# Patient Record
Sex: Female | Born: 1945 | Race: Black or African American | Hispanic: No | Smoking: Never smoker
Health system: Southern US, Community
[De-identification: ages and names within clinical notes are randomized; demographics above are authoritative.]

## PROBLEM LIST (undated history)

## (undated) DIAGNOSIS — I4891 Unspecified atrial fibrillation: Secondary | ICD-10-CM

## (undated) DIAGNOSIS — Z86711 Personal history of pulmonary embolism: Secondary | ICD-10-CM

## (undated) DIAGNOSIS — M1990 Unspecified osteoarthritis, unspecified site: Secondary | ICD-10-CM

## (undated) DIAGNOSIS — K649 Unspecified hemorrhoids: Secondary | ICD-10-CM

## (undated) DIAGNOSIS — E559 Vitamin D deficiency, unspecified: Secondary | ICD-10-CM

## (undated) DIAGNOSIS — I10 Essential (primary) hypertension: Secondary | ICD-10-CM

## (undated) DIAGNOSIS — E785 Hyperlipidemia, unspecified: Secondary | ICD-10-CM

## (undated) DIAGNOSIS — I499 Cardiac arrhythmia, unspecified: Secondary | ICD-10-CM

## (undated) DIAGNOSIS — K219 Gastro-esophageal reflux disease without esophagitis: Secondary | ICD-10-CM

## (undated) DIAGNOSIS — K5900 Constipation, unspecified: Secondary | ICD-10-CM

## (undated) DIAGNOSIS — D649 Anemia, unspecified: Secondary | ICD-10-CM

## (undated) HISTORY — PX: ABDOMINAL HYSTERECTOMY: SHX81

## (undated) HISTORY — PX: CATARACT EXTRACTION, BILATERAL: SHX1313

## (undated) HISTORY — PX: OTHER SURGICAL HISTORY: SHX169

## (undated) HISTORY — PX: COLONOSCOPY: SHX174

## (undated) HISTORY — PX: CARPAL TUNNEL RELEASE: SHX101

## (undated) HISTORY — PX: JOINT REPLACEMENT: SHX530

## (undated) HISTORY — PX: CARDIAC CATHETERIZATION: SHX172

## (undated) HISTORY — PX: EYE SURGERY: SHX253

---

## 2004-02-29 ENCOUNTER — Ambulatory Visit: Payer: Self-pay | Admitting: Family Medicine

## 2004-06-21 ENCOUNTER — Ambulatory Visit: Payer: Self-pay | Admitting: Unknown Physician Specialty

## 2005-03-10 ENCOUNTER — Ambulatory Visit: Payer: Self-pay | Admitting: Internal Medicine

## 2005-06-20 ENCOUNTER — Emergency Department: Payer: Self-pay | Admitting: Emergency Medicine

## 2006-03-15 ENCOUNTER — Ambulatory Visit: Payer: Self-pay | Admitting: Internal Medicine

## 2006-12-18 ENCOUNTER — Ambulatory Visit: Payer: Self-pay | Admitting: Internal Medicine

## 2007-03-21 ENCOUNTER — Ambulatory Visit: Payer: Self-pay | Admitting: Internal Medicine

## 2009-03-04 ENCOUNTER — Ambulatory Visit: Payer: Self-pay | Admitting: Internal Medicine

## 2009-09-27 ENCOUNTER — Ambulatory Visit: Payer: Self-pay | Admitting: Unknown Physician Specialty

## 2009-11-29 ENCOUNTER — Emergency Department: Payer: Self-pay | Admitting: Emergency Medicine

## 2010-02-16 ENCOUNTER — Ambulatory Visit: Payer: Self-pay | Admitting: Unknown Physician Specialty

## 2010-04-12 ENCOUNTER — Ambulatory Visit: Payer: Self-pay | Admitting: Family Medicine

## 2011-04-18 ENCOUNTER — Ambulatory Visit: Payer: Self-pay | Admitting: Family Medicine

## 2011-04-21 ENCOUNTER — Ambulatory Visit: Payer: Self-pay | Admitting: Family Medicine

## 2012-05-07 ENCOUNTER — Ambulatory Visit: Payer: Self-pay | Admitting: Family Medicine

## 2012-10-14 ENCOUNTER — Ambulatory Visit: Payer: Self-pay | Admitting: Unknown Physician Specialty

## 2012-10-16 LAB — PATHOLOGY REPORT

## 2013-05-12 ENCOUNTER — Ambulatory Visit: Payer: Self-pay | Admitting: Family Medicine

## 2014-02-25 DIAGNOSIS — K219 Gastro-esophageal reflux disease without esophagitis: Secondary | ICD-10-CM | POA: Insufficient documentation

## 2014-02-25 DIAGNOSIS — R7302 Impaired glucose tolerance (oral): Secondary | ICD-10-CM | POA: Insufficient documentation

## 2014-02-25 DIAGNOSIS — E785 Hyperlipidemia, unspecified: Secondary | ICD-10-CM | POA: Insufficient documentation

## 2014-05-14 ENCOUNTER — Ambulatory Visit: Payer: Self-pay | Admitting: Family Medicine

## 2014-05-28 ENCOUNTER — Ambulatory Visit: Payer: Self-pay | Admitting: Family Medicine

## 2014-10-12 DIAGNOSIS — M1712 Unilateral primary osteoarthritis, left knee: Secondary | ICD-10-CM | POA: Insufficient documentation

## 2014-10-12 DIAGNOSIS — M25552 Pain in left hip: Secondary | ICD-10-CM | POA: Insufficient documentation

## 2015-03-23 ENCOUNTER — Encounter: Payer: Self-pay | Admitting: *Deleted

## 2015-03-24 ENCOUNTER — Encounter: Payer: Self-pay | Admitting: Anesthesiology

## 2015-03-24 ENCOUNTER — Ambulatory Visit: Payer: Medicare Other | Admitting: Anesthesiology

## 2015-03-24 ENCOUNTER — Ambulatory Visit
Admission: RE | Admit: 2015-03-24 | Discharge: 2015-03-24 | Disposition: A | Discharge status: Home Health | Payer: Medicare Other | Source: Ambulatory Visit | Attending: Unknown Physician Specialty | Admitting: Unknown Physician Specialty

## 2015-03-24 ENCOUNTER — Encounter
Admission: RE | Disposition: A | Discharge status: Home Health | Payer: Self-pay | Source: Ambulatory Visit | Attending: Unknown Physician Specialty

## 2015-03-24 DIAGNOSIS — I10 Essential (primary) hypertension: Secondary | ICD-10-CM | POA: Insufficient documentation

## 2015-03-24 DIAGNOSIS — Z7982 Long term (current) use of aspirin: Secondary | ICD-10-CM | POA: Insufficient documentation

## 2015-03-24 DIAGNOSIS — M1990 Unspecified osteoarthritis, unspecified site: Secondary | ICD-10-CM | POA: Diagnosis not present

## 2015-03-24 DIAGNOSIS — K449 Diaphragmatic hernia without obstruction or gangrene: Secondary | ICD-10-CM | POA: Insufficient documentation

## 2015-03-24 DIAGNOSIS — R1310 Dysphagia, unspecified: Secondary | ICD-10-CM | POA: Diagnosis present

## 2015-03-24 DIAGNOSIS — Z79899 Other long term (current) drug therapy: Secondary | ICD-10-CM | POA: Diagnosis not present

## 2015-03-24 DIAGNOSIS — K219 Gastro-esophageal reflux disease without esophagitis: Secondary | ICD-10-CM | POA: Diagnosis not present

## 2015-03-24 DIAGNOSIS — E785 Hyperlipidemia, unspecified: Secondary | ICD-10-CM | POA: Insufficient documentation

## 2015-03-24 DIAGNOSIS — E559 Vitamin D deficiency, unspecified: Secondary | ICD-10-CM | POA: Insufficient documentation

## 2015-03-24 DIAGNOSIS — R109 Unspecified abdominal pain: Secondary | ICD-10-CM | POA: Diagnosis not present

## 2015-03-24 HISTORY — DX: Unspecified osteoarthritis, unspecified site: M19.90

## 2015-03-24 HISTORY — DX: Gastro-esophageal reflux disease without esophagitis: K21.9

## 2015-03-24 HISTORY — DX: Essential (primary) hypertension: I10

## 2015-03-24 HISTORY — DX: Unspecified hemorrhoids: K64.9

## 2015-03-24 HISTORY — PX: ESOPHAGOGASTRODUODENOSCOPY (EGD) WITH PROPOFOL: SHX5813

## 2015-03-24 HISTORY — DX: Vitamin D deficiency, unspecified: E55.9

## 2015-03-24 HISTORY — DX: Constipation, unspecified: K59.00

## 2015-03-24 HISTORY — DX: Hyperlipidemia, unspecified: E78.5

## 2015-03-24 SURGERY — ESOPHAGOGASTRODUODENOSCOPY (EGD) WITH PROPOFOL
Anesthesia: General

## 2015-03-24 MED ORDER — PROPOFOL 10 MG/ML IV BOLUS
INTRAVENOUS | Status: DC | PRN
  Administered 2015-03-24 (×2): 20 mg via INTRAVENOUS
  Administered 2015-03-24 (×2): 50 mg via INTRAVENOUS

## 2015-03-24 MED ORDER — SODIUM CHLORIDE 0.9 % IV SOLN
INTRAVENOUS | Status: DC
  Administered 2015-03-24: 10:00:00 via INTRAVENOUS

## 2015-03-24 MED ORDER — SODIUM CHLORIDE 0.9 % IV SOLN
INTRAVENOUS | Status: DC
  Administered 2015-03-24: 1000 mL via INTRAVENOUS

## 2015-03-24 MED ORDER — GLYCOPYRROLATE 0.2 MG/ML IJ SOLN
INTRAMUSCULAR | Status: DC | PRN
  Administered 2015-03-24: 0.2 mg via INTRAVENOUS

## 2015-03-24 MED ORDER — PROPOFOL 500 MG/50ML IV EMUL
INTRAVENOUS | Status: DC | PRN
  Administered 2015-03-24: 140 ug/kg/min via INTRAVENOUS

## 2015-03-24 MED ORDER — BUTAMBEN-TETRACAINE-BENZOCAINE 2-2-14 % EX AERO
INHALATION_SPRAY | CUTANEOUS | Status: DC | PRN
  Administered 2015-03-24 (×3): 1 via TOPICAL

## 2015-03-24 NOTE — Transfer of Care (Signed)
Immediate Anesthesia Transfer of Care Note  Patient: Laura Zhang  Procedure(s) Performed: Procedure(s): ESOPHAGOGASTRODUODENOSCOPY (EGD) WITH PROPOFOL (N/A)  Patient Location: PACU  Anesthesia Type:General  Level of Consciousness: awake, alert  and oriented  Airway & Oxygen Therapy: Patient Spontanous Breathing and Patient connected to nasal cannula oxygen  Post-op Assessment: Report given to RN and Post -op Vital signs reviewed and stable  Post vital signs: stable  Last Vitals:  Filed Vitals:   03/24/15 0957  BP: 128/74  Pulse: 84  Temp: 37.2 C  Resp: 16    Complications: No apparent anesthesia complications

## 2015-03-24 NOTE — H&P (Signed)
   Primary Care Physician:  [facility], MD Primary Gastroenterologist:  Dr. Vira Agar  Pre-Procedure History & Physical: HPI:  Laura Zhang is a 69 y.o. female is here for an endoscopy.   Past Medical History  Diagnosis Date  . Arthritis   . Constipation   . GERD (gastroesophageal reflux disease)   . Hemorrhoids   . Hyperlipidemia   . Hypertension   . Vitamin D deficiency     Past Surgical History  Procedure Laterality Date  . Abdominal hysterectomy    . Carpal tunnel release Left   . Great toe fusion      Prior to Admission medications   Medication Sig Start Date End Date Taking? Authorizing Provider  acetaminophen (TYLENOL) 650 MG CR tablet Take 650 mg by mouth every 8 (eight) hours as needed for pain.   Yes Historical Provider, MD  amLODipine (NORVASC) 10 MG tablet Take 10 mg by mouth daily.   Yes Historical Provider, MD  aspirin 81 MG tablet Take 81 mg by mouth daily.   Yes Historical Provider, MD  calcium citrate-vitamin D (CITRACAL+D) 315-200 MG-UNIT tablet Take 1 tablet by mouth daily.   Yes Historical Provider, MD  losartan (COZAAR) 50 MG tablet Take 50 mg by mouth daily.   Yes Historical Provider, MD  lovastatin (MEVACOR) 10 MG tablet Take 10 mg by mouth at bedtime.   Yes Historical Provider, MD  ondansetron (ZOFRAN) 4 MG tablet Take 4 mg by mouth every 8 (eight) hours as needed for nausea or vomiting.   Yes Historical Provider, MD  pantoprazole (PROTONIX) 40 MG tablet Take 40 mg by mouth 2 (two) times daily before a meal.   Yes Historical Provider, MD  sucralfate (CARAFATE) 1 G tablet Take 1 g by mouth 4 (four) times daily -  with meals and at bedtime.   Yes Historical Provider, MD  triamterene-hydrochlorothiazide (MAXZIDE-25) 37.5-25 MG tablet Take 1 tablet by mouth daily.   Yes Historical Provider, MD    Allergies as of 03/23/2015  . (Not on File)    History reviewed. No pertinent family history.  Social History   Social History  . Marital  Status: Married    Spouse Name: N/A  . Number of Children: N/A  . Years of Education: N/A   Occupational History  . Not on file.   Social History Main Topics  . Smoking status: Never Smoker   . Smokeless tobacco: Never Used  . Alcohol Use: No  . Drug Use: No  . Sexual Activity: Not on file   Other Topics Concern  . Not on file   Social History Narrative  . No narrative on file    Review of Systems: See HPI, otherwise negative ROS  Physical Exam: There were no vitals taken for this visit. General:   Alert,  pleasant and cooperative in NAD Head:  Normocephalic and atraumatic. Neck:  Supple; no masses or thyromegaly. Lungs:  Clear throughout to auscultation.    Heart:  Regular rate and rhythm. Abdomen:  Soft, nontender and nondistended. Normal bowel sounds, without guarding, and without rebound.   Neurologic:  Alert and  oriented x4;  grossly normal neurologically.  Impression/Plan: Laura Zhang is here for an endoscopy to be performed for dysphagia, abdominal pain, GERD  Risks, benefits, limitations, and alternatives regarding  endoscopy have been reviewed with the patient.  Questions have been answered.  All parties agreeable.   Gaylyn Cheers, MD  03/24/2015, 9:56 AM

## 2015-03-24 NOTE — Anesthesia Preprocedure Evaluation (Signed)
Anesthesia Evaluation  Patient identified by MRN, date of birth, ID band Patient awake    Reviewed: Allergy & Precautions, NPO status , Patient's Chart, lab work & pertinent test results  Airway Mallampati: II  TM Distance: >3 FB Neck ROM: Full    Dental  (+) Partial Lower, Partial Upper   Pulmonary neg pulmonary ROS,    Pulmonary exam normal breath sounds clear to auscultation       Cardiovascular hypertension, Pt. on medications Normal cardiovascular exam     Neuro/Psych negative neurological ROS  negative psych ROS   GI/Hepatic Neg liver ROS, GERD  Medicated and Controlled,  Endo/Other  negative endocrine ROS  Renal/GU negative Renal ROS     Musculoskeletal  (+) Arthritis , Osteoarthritis,    Abdominal Normal abdominal exam  (+)   Peds negative pediatric ROS (+)  Hematology negative hematology ROS (+)   Anesthesia Other Findings   Reproductive/Obstetrics                             Anesthesia Physical Anesthesia Plan  ASA: II  Anesthesia Plan: General   Post-op Pain Management:    Induction: Intravenous  Airway Management Planned: Nasal Cannula  Additional Equipment:   Intra-op Plan:   Post-operative Plan:   Informed Consent: I have reviewed the patients History and Physical, chart, labs and discussed the procedure including the risks, benefits and alternatives for the proposed anesthesia with the patient or authorized representative who has indicated his/her understanding and acceptance.   Dental advisory given  Plan Discussed with: CRNA and Surgeon  Anesthesia Plan Comments:         Anesthesia Quick Evaluation

## 2015-03-24 NOTE — Op Note (Signed)
[facility] Gastroenterology Patient Name: Laura Zhang Procedure Date: 03/24/2015 10:16 AM MRN: [id number] Account #: [id number] Date of Birth: 1945/12/25 Admit Type: Outpatient Age: 69 Room: [facility] ENDO ROOM 3 Gender: Female Note Status: Finalized Procedure:         Upper GI endoscopy Providers:         Manya Silvas, MD Referring MD:      Sofie Hartigan (Referring MD) Medicines:         Propofol per Anesthesia, Cetacaine spray Complications:     No immediate complications. Procedure:         Pre-Anesthesia Assessment:                    - After reviewing the risks and benefits, the patient was                     deemed in satisfactory condition to undergo the procedure.                    After obtaining informed consent, the endoscope was passed                     under direct vision. Throughout the procedure, the                     patient's blood pressure, pulse, and oxygen saturations                     were monitored continuously. The Endoscope was introduced                     through the mouth, and advanced to the second part of                     duodenum. The upper GI endoscopy was accomplished without                     difficulty. The patient tolerated the procedure well. Findings:      Patient with very active gag reflex and should be done with spray in the       future.      The examined esophagus was normal. GEJ 39cm.      A small-medium-sized hiatus hernia was present.      The stomach was normal.      The examined duodenum was normal. Impression:        - Normal esophagus.                    - Medium-sized hiatus hernia.                    - Normal stomach.                    - Normal examined duodenum.                    - No specimens collected. Recommendation:    - The findings and recommendations were discussed with the                     patient's family. Eat slowly, chew well, take small bites. Manya Silvas,  MD 03/24/2015 10:44:49 AM This report has been signed electronically. Number of Addenda: 0 Note Initiated On: 03/24/2015 10:16 AM  [facility]

## 2015-03-25 ENCOUNTER — Encounter: Payer: Self-pay | Admitting: Unknown Physician Specialty

## 2015-03-29 NOTE — Anesthesia Postprocedure Evaluation (Signed)
Anesthesia Post Note  Patient: Laura Zhang  Procedure(s) Performed: Procedure(s) (LRB): ESOPHAGOGASTRODUODENOSCOPY (EGD) WITH PROPOFOL (N/A)  Patient location during evaluation: Endoscopy Anesthesia Type: General Level of consciousness: awake, awake and alert and oriented Pain management: pain level controlled Vital Signs Assessment: post-procedure vital signs reviewed and stable Respiratory status: spontaneous breathing Cardiovascular status: blood pressure returned to baseline Anesthetic complications: no    Last Vitals:  Filed Vitals:   03/24/15 1105 03/24/15 1115  BP: 98/59 112/59  Pulse: 65 65  Temp:    Resp: 15 17    Last Pain:  Filed Vitals:   03/25/15 0815  PainSc: 0-No pain                 Julio Storr

## 2015-07-13 ENCOUNTER — Other Ambulatory Visit: Payer: Self-pay | Admitting: Family Medicine

## 2015-07-13 DIAGNOSIS — Z1231 Encounter for screening mammogram for malignant neoplasm of breast: Secondary | ICD-10-CM

## 2015-07-14 ENCOUNTER — Ambulatory Visit
Admission: RE | Admit: 2015-07-14 | Discharge: 2015-07-14 | Disposition: A | Discharge status: Home / Self Care | Payer: Medicare Other | Source: Ambulatory Visit | Attending: Family Medicine | Admitting: Family Medicine

## 2015-07-14 ENCOUNTER — Other Ambulatory Visit: Payer: Self-pay | Admitting: Family Medicine

## 2015-07-14 DIAGNOSIS — Z1231 Encounter for screening mammogram for malignant neoplasm of breast: Secondary | ICD-10-CM

## 2016-04-30 DIAGNOSIS — I9789 Other postprocedural complications and disorders of the circulatory system, not elsewhere classified: Secondary | ICD-10-CM | POA: Insufficient documentation

## 2016-04-30 DIAGNOSIS — I4891 Unspecified atrial fibrillation: Secondary | ICD-10-CM | POA: Insufficient documentation

## 2016-05-30 ENCOUNTER — Encounter
Admission: RE | Admit: 2016-05-30 | Discharge: 2016-05-30 | Disposition: A | Discharge status: Home / Self Care | Payer: Medicare Other | Source: Ambulatory Visit | Attending: Unknown Physician Specialty | Admitting: Unknown Physician Specialty

## 2016-05-30 DIAGNOSIS — Z8262 Family history of osteoporosis: Secondary | ICD-10-CM | POA: Diagnosis not present

## 2016-05-30 DIAGNOSIS — Z96652 Presence of left artificial knee joint: Secondary | ICD-10-CM | POA: Diagnosis present

## 2016-05-30 DIAGNOSIS — Z9889 Other specified postprocedural states: Secondary | ICD-10-CM | POA: Diagnosis not present

## 2016-05-30 DIAGNOSIS — Z833 Family history of diabetes mellitus: Secondary | ICD-10-CM | POA: Diagnosis not present

## 2016-05-30 DIAGNOSIS — Z8249 Family history of ischemic heart disease and other diseases of the circulatory system: Secondary | ICD-10-CM | POA: Diagnosis not present

## 2016-05-30 DIAGNOSIS — Z811 Family history of alcohol abuse and dependence: Secondary | ICD-10-CM | POA: Diagnosis not present

## 2016-05-30 DIAGNOSIS — E785 Hyperlipidemia, unspecified: Secondary | ICD-10-CM | POA: Diagnosis not present

## 2016-05-30 DIAGNOSIS — K219 Gastro-esophageal reflux disease without esophagitis: Secondary | ICD-10-CM | POA: Diagnosis not present

## 2016-05-30 DIAGNOSIS — Z90710 Acquired absence of both cervix and uterus: Secondary | ICD-10-CM | POA: Diagnosis not present

## 2016-05-30 DIAGNOSIS — Z7901 Long term (current) use of anticoagulants: Secondary | ICD-10-CM | POA: Diagnosis not present

## 2016-05-30 DIAGNOSIS — M24662 Ankylosis, left knee: Secondary | ICD-10-CM | POA: Diagnosis present

## 2016-05-30 DIAGNOSIS — I4891 Unspecified atrial fibrillation: Secondary | ICD-10-CM | POA: Diagnosis not present

## 2016-05-30 DIAGNOSIS — E559 Vitamin D deficiency, unspecified: Secondary | ICD-10-CM | POA: Diagnosis not present

## 2016-05-30 DIAGNOSIS — Z86711 Personal history of pulmonary embolism: Secondary | ICD-10-CM | POA: Diagnosis not present

## 2016-05-30 DIAGNOSIS — I10 Essential (primary) hypertension: Secondary | ICD-10-CM | POA: Diagnosis not present

## 2016-05-30 DIAGNOSIS — Z8349 Family history of other endocrine, nutritional and metabolic diseases: Secondary | ICD-10-CM | POA: Diagnosis not present

## 2016-05-30 DIAGNOSIS — Z7982 Long term (current) use of aspirin: Secondary | ICD-10-CM | POA: Diagnosis not present

## 2016-05-30 DIAGNOSIS — Z8261 Family history of arthritis: Secondary | ICD-10-CM | POA: Diagnosis not present

## 2016-05-30 DIAGNOSIS — Z79899 Other long term (current) drug therapy: Secondary | ICD-10-CM | POA: Diagnosis not present

## 2016-05-30 HISTORY — DX: Anemia, unspecified: D64.9

## 2016-05-30 HISTORY — DX: Personal history of pulmonary embolism: Z86.711

## 2016-05-30 HISTORY — DX: Unspecified atrial fibrillation: I48.91

## 2016-05-30 NOTE — Pre-Procedure Instructions (Signed)
Medical clearance on chart. 

## 2016-05-30 NOTE — Patient Instructions (Addendum)
  Your procedure is scheduled on: 05/21/16 @ 8:30 am Report to Same Day Surgery 2nd floor medical mall [facility] Entrance-take elevator on left to 2nd floor.  Check in with surgery information desk.)  Remember: Instructions that are not followed completely may result in serious medical risk, up to and including death, or upon the discretion of your surgeon and anesthesiologist your surgery may need to be rescheduled.    _x___ 1. Do not eat food or drink liquids after midnight. No gum chewing or hard candies.     __x__ 2. No Alcohol for 24 hours before or after surgery.   __x__3. No Smoking for 24 prior to surgery.   ____  4. Bring all medications with you on the day of surgery if instructed.    __x__ 5. Notify your doctor if there is any change in your medical condition     (cold, fever, infections).     Do not wear jewelry, make-up, hairpins, clips or nail polish.  Do not wear lotions, powders, or perfumes. You may wear deodorant.  Do not shave 48 hours prior to surgery. Men may shave face and neck.  Do not bring valuables to the hospital.    [facility] is not responsible for any belongings or valuables.               Contacts, dentures or bridgework may not be worn into surgery.  Leave your suitcase in the car. After surgery it may be brought to your room.  For patients admitted to the hospital, discharge time is determined by your treatment team.   Patients discharged the day of surgery will not be allowed to drive home.  You will need someone to drive you home and stay with you the night of your procedure.    Please read over the following fact sheets that you were given:   [facility] Preparing for Surgery and or MRSA Information   _x___ Take these medicines the morning of surgery with A SIP OF WATER:    1. pantoprazole (PROTONIX  2.  3.  4.  5.  6.  ____Fleets enema or Magnesium Citrate as directed.   _x___ Use CHG Soap or sage wipes as directed on instruction  sheet   ____ Use inhalers on the day of surgery and bring to hospital day of surgery  ____ Stop metformin 2 days prior to surgery    ____ Take 1/2 of usual insulin dose the night before surgery and none on the morning of           surgery.   ____ Stop Aspirin, Coumadin, Pllavix ,Eliquis, Effient, or Pradaxa  x__ Stop Anti-inflammatories such as Advil, Aleve, Ibuprofen, Motrin, Naproxen,          Naprosyn, Goodies powders or aspirin products. Ok to take Tylenol.   ____ Stop supplements until after surgery.    ____ Bring C-Pap to the hospital.

## 2016-05-30 NOTE — Pre-Procedure Instructions (Signed)
Component Name Value Ref Range  Vent Rate (bpm) 69   PR Interval (msec) 188   QRS Interval (msec) 86   QT Interval (msec) 404   QTc (msec) 432   Result Narrative  Normal sinus rhythm Normal ECG No previous ECGs available I reviewed and concur with this report. Electronically signed [id number], D.O., [place] ([id number]) on 04/30/2016 10:51:49 PM  Status Results Details    Hospital Encounter on 04/30/2016 [place]")' href="[email or web address]/">Encounter Summary

## 2016-05-31 ENCOUNTER — Ambulatory Visit
Admission: RE | Admit: 2016-05-31 | Discharge: 2016-05-31 | Disposition: A | Discharge status: Home / Self Care | Payer: Medicare Other | Source: Ambulatory Visit | Attending: Unknown Physician Specialty | Admitting: Unknown Physician Specialty

## 2016-05-31 ENCOUNTER — Encounter
Admission: RE | Disposition: A | Discharge status: Home / Self Care | Payer: Self-pay | Source: Ambulatory Visit | Attending: Unknown Physician Specialty

## 2016-05-31 ENCOUNTER — Ambulatory Visit: Payer: Medicare Other | Admitting: Certified Registered Nurse Anesthetist

## 2016-05-31 ENCOUNTER — Encounter: Payer: Self-pay | Admitting: *Deleted

## 2016-05-31 DIAGNOSIS — M24662 Ankylosis, left knee: Secondary | ICD-10-CM | POA: Insufficient documentation

## 2016-05-31 DIAGNOSIS — Z8349 Family history of other endocrine, nutritional and metabolic diseases: Secondary | ICD-10-CM | POA: Insufficient documentation

## 2016-05-31 DIAGNOSIS — Z8262 Family history of osteoporosis: Secondary | ICD-10-CM | POA: Insufficient documentation

## 2016-05-31 DIAGNOSIS — Z9889 Other specified postprocedural states: Secondary | ICD-10-CM | POA: Insufficient documentation

## 2016-05-31 DIAGNOSIS — E785 Hyperlipidemia, unspecified: Secondary | ICD-10-CM | POA: Insufficient documentation

## 2016-05-31 DIAGNOSIS — Z86711 Personal history of pulmonary embolism: Secondary | ICD-10-CM | POA: Insufficient documentation

## 2016-05-31 DIAGNOSIS — Z8261 Family history of arthritis: Secondary | ICD-10-CM | POA: Insufficient documentation

## 2016-05-31 DIAGNOSIS — Z8249 Family history of ischemic heart disease and other diseases of the circulatory system: Secondary | ICD-10-CM | POA: Insufficient documentation

## 2016-05-31 DIAGNOSIS — Z90710 Acquired absence of both cervix and uterus: Secondary | ICD-10-CM | POA: Insufficient documentation

## 2016-05-31 DIAGNOSIS — Z96652 Presence of left artificial knee joint: Secondary | ICD-10-CM | POA: Insufficient documentation

## 2016-05-31 DIAGNOSIS — K219 Gastro-esophageal reflux disease without esophagitis: Secondary | ICD-10-CM | POA: Insufficient documentation

## 2016-05-31 DIAGNOSIS — I4891 Unspecified atrial fibrillation: Secondary | ICD-10-CM | POA: Insufficient documentation

## 2016-05-31 DIAGNOSIS — E559 Vitamin D deficiency, unspecified: Secondary | ICD-10-CM | POA: Insufficient documentation

## 2016-05-31 DIAGNOSIS — Z833 Family history of diabetes mellitus: Secondary | ICD-10-CM | POA: Insufficient documentation

## 2016-05-31 DIAGNOSIS — Z7901 Long term (current) use of anticoagulants: Secondary | ICD-10-CM | POA: Insufficient documentation

## 2016-05-31 DIAGNOSIS — Z7982 Long term (current) use of aspirin: Secondary | ICD-10-CM | POA: Insufficient documentation

## 2016-05-31 DIAGNOSIS — Z811 Family history of alcohol abuse and dependence: Secondary | ICD-10-CM | POA: Insufficient documentation

## 2016-05-31 DIAGNOSIS — Z79899 Other long term (current) drug therapy: Secondary | ICD-10-CM | POA: Insufficient documentation

## 2016-05-31 DIAGNOSIS — I10 Essential (primary) hypertension: Secondary | ICD-10-CM | POA: Insufficient documentation

## 2016-05-31 HISTORY — PX: KNEE CLOSED REDUCTION: SHX995

## 2016-05-31 SURGERY — MANIPULATION, KNEE, CLOSED
Anesthesia: General | Laterality: Left

## 2016-05-31 MED ORDER — DEXAMETHASONE SODIUM PHOSPHATE 10 MG/ML IJ SOLN
INTRAMUSCULAR | Status: DC | PRN
  Administered 2016-05-31: 5 mg via INTRAVENOUS

## 2016-05-31 MED ORDER — MIDAZOLAM HCL 2 MG/2ML IJ SOLN
INTRAMUSCULAR | Status: DC | PRN
  Administered 2016-05-31: 2 mg via INTRAVENOUS

## 2016-05-31 MED ORDER — LIDOCAINE HCL (PF) 2 % IJ SOLN
INTRAMUSCULAR | Status: AC
  Filled 2016-05-31: qty 2

## 2016-05-31 MED ORDER — LACTATED RINGERS IV SOLN
INTRAVENOUS | Status: DC
  Administered 2016-05-31: 09:00:00 via INTRAVENOUS

## 2016-05-31 MED ORDER — ACETAMINOPHEN 10 MG/ML IV SOLN
INTRAVENOUS | Status: DC | PRN
  Administered 2016-05-31: 1000 mg via INTRAVENOUS

## 2016-05-31 MED ORDER — ACETAMINOPHEN 10 MG/ML IV SOLN
INTRAVENOUS | Status: AC
  Filled 2016-05-31: qty 100

## 2016-05-31 MED ORDER — DEXAMETHASONE SODIUM PHOSPHATE 10 MG/ML IJ SOLN
INTRAMUSCULAR | Status: AC
  Filled 2016-05-31: qty 1

## 2016-05-31 MED ORDER — LIDOCAINE HCL 2 % EX GEL
CUTANEOUS | Status: AC
  Filled 2016-05-31: qty 5

## 2016-05-31 MED ORDER — FENTANYL CITRATE (PF) 100 MCG/2ML IJ SOLN
INTRAMUSCULAR | Status: AC
  Filled 2016-05-31: qty 2

## 2016-05-31 MED ORDER — ONDANSETRON HCL 4 MG/2ML IJ SOLN
INTRAMUSCULAR | Status: AC
  Filled 2016-05-31: qty 2

## 2016-05-31 MED ORDER — SUCCINYLCHOLINE CHLORIDE 200 MG/10ML IV SOSY
PREFILLED_SYRINGE | INTRAVENOUS | Status: AC
  Filled 2016-05-31: qty 10

## 2016-05-31 MED ORDER — LIDOCAINE HCL (CARDIAC) 20 MG/ML IV SOLN
INTRAVENOUS | Status: DC | PRN
  Administered 2016-05-31: 100 mg via INTRAVENOUS

## 2016-05-31 MED ORDER — SUCCINYLCHOLINE CHLORIDE 20 MG/ML IJ SOLN
INTRAMUSCULAR | Status: DC | PRN
  Administered 2016-05-31: 100 mg via INTRAVENOUS

## 2016-05-31 MED ORDER — GLYCOPYRROLATE 0.2 MG/ML IJ SOLN
INTRAMUSCULAR | Status: DC | PRN
  Administered 2016-05-31: 0.2 mg via INTRAVENOUS

## 2016-05-31 MED ORDER — OXYCODONE HCL 5 MG/5ML PO SOLN: 5.0000 mg | Freq: Once | ORAL | Status: DC | PRN

## 2016-05-31 MED ORDER — PROPOFOL 10 MG/ML IV BOLUS
INTRAVENOUS | Status: AC
  Filled 2016-05-31: qty 20

## 2016-05-31 MED ORDER — FENTANYL CITRATE (PF) 100 MCG/2ML IJ SOLN
INTRAMUSCULAR | Status: DC | PRN
  Administered 2016-05-31: 50 ug via INTRAVENOUS

## 2016-05-31 MED ORDER — GLYCOPYRROLATE 0.2 MG/ML IJ SOLN
INTRAMUSCULAR | Status: AC
  Filled 2016-05-31: qty 1

## 2016-05-31 MED ORDER — MIDAZOLAM HCL 2 MG/2ML IJ SOLN
INTRAMUSCULAR | Status: AC
  Filled 2016-05-31: qty 2

## 2016-05-31 MED ORDER — FENTANYL CITRATE (PF) 100 MCG/2ML IJ SOLN: 25.0000 ug | INTRAMUSCULAR | Status: DC | PRN

## 2016-05-31 MED ORDER — PROPOFOL 10 MG/ML IV BOLUS
INTRAVENOUS | Status: DC | PRN
Start: 2016-05-31 — End: 2016-05-31
  Administered 2016-05-31: 150 mg via INTRAVENOUS

## 2016-05-31 MED ORDER — OXYCODONE HCL 5 MG PO TABS: 5.0000 mg | ORAL_TABLET | Freq: Once | ORAL | Status: DC | PRN

## 2016-05-31 MED ORDER — ONDANSETRON HCL 4 MG/2ML IJ SOLN
INTRAMUSCULAR | Status: DC | PRN
  Administered 2016-05-31: 4 mg via INTRAVENOUS

## 2016-05-31 SURGICAL SUPPLY — 1 items: WRAP KNEE W/COLD PACKS 25.5X14 (SOFTGOODS) ×2 IMPLANT

## 2016-05-31 NOTE — Discharge Instructions (Signed)
Ice pack Elevation RTC in about 1 wk. Resume outpt. PT  AMBULATORY SURGERY  DISCHARGE INSTRUCTIONS   1) The drugs that you were given will stay in your system until tomorrow so for the next 24 hours you should not:  A) Drive an automobile B) Make any legal decisions C) Drink any alcoholic beverage   2) You may resume regular meals tomorrow.  Today it is better to start with liquids and gradually work up to solid foods.  You may eat anything you prefer, but it is better to start with liquids, then soup and crackers, and gradually work up to solid foods.   3) Please notify your doctor immediately if you have any unusual bleeding, trouble breathing, redness and pain at the surgery site, drainage, fever, or pain not relieved by medication.    4) Additional Instructions:        Please contact your physician with any problems or Same Day Surgery at [phone], Monday through Friday 6 am to 4 pm, or [place] at [facility] number at [phone].

## 2016-05-31 NOTE — Anesthesia Preprocedure Evaluation (Signed)
Anesthesia Evaluation  Patient identified by MRN, date of birth, ID band Patient awake    Reviewed: Allergy & Precautions, H&P , NPO status , Patient's Chart, lab work & pertinent test results  History of Anesthesia Complications Negative for: history of anesthetic complications  Airway Mallampati: III  TM Distance: >3 FB Neck ROM: limited    Dental no notable dental hx. (+) Poor Dentition, Chipped, Missing, Partial Lower, Partial Upper   Pulmonary neg pulmonary ROS, neg shortness of breath,    Pulmonary exam normal breath sounds clear to auscultation       Cardiovascular Exercise Tolerance: Good hypertension, (-) angina(-) DOE Normal cardiovascular exam+ dysrhythmias Atrial Fibrillation  Rhythm:regular Rate:Normal     Neuro/Psych negative neurological ROS  negative psych ROS   GI/Hepatic Neg liver ROS, GERD  Controlled,  Endo/Other  negative endocrine ROS  Renal/GU      Musculoskeletal  (+) Arthritis ,   Abdominal   Peds  Hematology negative hematology ROS (+)   Anesthesia Other Findings Past Medical History: No date: Anemia No date: Arthritis No date: Atrial fibrillation (HCC) No date: Constipation No date: GERD (gastroesophageal reflux disease) No date: Hemorrhoids No date: History of pulmonary embolus (PE) No date: Hyperlipidemia No date: Hypertension No date: Vitamin D deficiency  Past Surgical History: No date: ABDOMINAL HYSTERECTOMY No date: CARPAL TUNNEL RELEASE Left 03/24/2015: ESOPHAGOGASTRODUODENOSCOPY (EGD) WITH PROPOFOL N/A     Comment: Procedure: ESOPHAGOGASTRODUODENOSCOPY (EGD)               WITH PROPOFOL;  Surgeon: Manya Silvas, MD;               Location: [facility] ENDOSCOPY;  Service: Endoscopy;               Laterality: N/A; No date: great toe fusion Left No date: JOINT REPLACEMENT     Comment: Left knee  BMI    Body Mass Index:  32.61 kg/m       Reproductive/Obstetrics negative OB ROS                             Anesthesia Physical Anesthesia Plan  ASA: III  Anesthesia Plan: General ETT   Post-op Pain Management:    Induction:   Airway Management Planned:   Additional Equipment:   Intra-op Plan:   Post-operative Plan:   Informed Consent: I have reviewed the patients History and Physical, chart, labs and discussed the procedure including the risks, benefits and alternatives for the proposed anesthesia with the patient or authorized representative who has indicated his/her understanding and acceptance.   Dental Advisory Given  Plan Discussed with: Anesthesiologist, CRNA and Surgeon  Anesthesia Plan Comments:         Anesthesia Quick Evaluation

## 2016-05-31 NOTE — Anesthesia Postprocedure Evaluation (Signed)
Anesthesia Post Note  Patient: HADARA HOLSMAN  Procedure(s) Performed: Procedure(s) (LRB): CLOSED MANIPULATION KNEE (Left)  Patient location during evaluation: PACU Anesthesia Type: General Level of consciousness: awake and alert Pain management: pain level controlled Vital Signs Assessment: post-procedure vital signs reviewed and stable Respiratory status: spontaneous breathing, nonlabored ventilation, respiratory function stable and patient connected to nasal cannula oxygen Cardiovascular status: blood pressure returned to baseline and stable Postop Assessment: no signs of nausea or vomiting Anesthetic complications: no     Last Vitals:  Vitals:   05/31/16 1019 05/31/16 1034  BP: (!) 149/80   Pulse: 82   Resp: 18   Temp:  36.3 C    Last Pain:  Vitals:   05/31/16 1034  TempSrc:   PainSc: 0-No pain                 Precious Haws Kristyne Woodring

## 2016-05-31 NOTE — Op Note (Signed)
05/31/2016  6:48 PM  PATIENT:  Laura Zhang  71 y.o. 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 Her surgery was about 4 weeks ago. She was brought in for manipulation of the L knee  under anesthesia.  OP NOTE: Satisfactory general anesthesia was a obtained. I then manipulated the L knee. I was able to increase her flexion from 70 degrees to about 100 degrees. Ice pack was applied. Patient was awakened and taken to the recovery room in satisfactory condition.

## 2016-05-31 NOTE — Transfer of Care (Signed)
Immediate Anesthesia Transfer of Care Note  Patient: Laura Zhang  Procedure(s) Performed: Procedure(s): CLOSED MANIPULATION KNEE (Left)  Patient Location: PACU  Anesthesia Type:General  Level of Consciousness: sedated  Airway & Oxygen Therapy: Patient Spontanous Breathing and Patient connected to face mask oxygen  Post-op Assessment: Report given to RN and Post -op Vital signs reviewed and stable  Post vital signs: Reviewed and stable  Last Vitals:  Vitals:   05/31/16 0831  BP: 139/85  Pulse: 79  Resp: 16  Temp: 36.4 C    Last Pain:  Vitals:   05/31/16 0831  TempSrc: Tympanic         Complications: No apparent anesthesia complications

## 2016-05-31 NOTE — Anesthesia Procedure Notes (Signed)
Procedure Name: Intubation Date/Time: 05/31/2016 9:47 AM Performed by: Aline Brochure Pre-anesthesia Checklist: Patient identified, Emergency Drugs available, Suction available and Patient being monitored Patient Re-evaluated:Patient Re-evaluated prior to inductionOxygen Delivery Method: Circle system utilized Preoxygenation: Pre-oxygenation with 100% oxygen Intubation Type: IV induction Ventilation: Mask ventilation without difficulty Laryngoscope Size: Miller and 2 Grade View: Grade II Tube type: Oral Tube size: 7.0 mm Number of attempts: 2 Airway Equipment and Method: Stylet Placement Confirmation: ETT inserted through vocal cords under direct vision,  positive ETCO2 and breath sounds checked- equal and bilateral Secured at: 22 cm Tube secured with: Tape Dental Injury: Teeth and Oropharynx as per pre-operative assessment  Difficulty Due To: Difficult Airway- due to anterior larynx

## 2016-05-31 NOTE — Anesthesia Post-op Follow-up Note (Cosign Needed)
Anesthesia QCDR form completed.        

## 2016-05-31 NOTE — H&P (Signed)
  H and P reviewed. No changes. Uploaded at later date. 

## 2017-03-01 ENCOUNTER — Other Ambulatory Visit: Payer: Self-pay | Admitting: Family Medicine

## 2017-03-01 DIAGNOSIS — Z1231 Encounter for screening mammogram for malignant neoplasm of breast: Secondary | ICD-10-CM

## 2017-04-05 ENCOUNTER — Ambulatory Visit
Admission: RE | Admit: 2017-04-05 | Discharge: 2017-04-05 | Disposition: A | Discharge status: Home / Self Care | Payer: Medicare Other | Source: Ambulatory Visit | Attending: Family Medicine | Admitting: Family Medicine

## 2017-04-05 DIAGNOSIS — Z1231 Encounter for screening mammogram for malignant neoplasm of breast: Secondary | ICD-10-CM | POA: Diagnosis present

## 2017-06-05 DIAGNOSIS — E669 Obesity, unspecified: Secondary | ICD-10-CM | POA: Insufficient documentation

## 2017-06-29 ENCOUNTER — Encounter (INDEPENDENT_AMBULATORY_CARE_PROVIDER_SITE_OTHER): Payer: Self-pay | Admitting: Vascular Surgery

## 2017-06-29 ENCOUNTER — Ambulatory Visit (INDEPENDENT_AMBULATORY_CARE_PROVIDER_SITE_OTHER): Payer: Medicare Other | Admitting: Vascular Surgery

## 2017-06-29 VITALS — BP 153/85 | HR 78 | Resp 16 | Ht 64.5 in | Wt 192.8 lb

## 2017-06-29 DIAGNOSIS — I10 Essential (primary) hypertension: Secondary | ICD-10-CM | POA: Diagnosis not present

## 2017-06-29 DIAGNOSIS — M1990 Unspecified osteoarthritis, unspecified site: Secondary | ICD-10-CM | POA: Diagnosis not present

## 2017-06-29 DIAGNOSIS — Z86711 Personal history of pulmonary embolism: Secondary | ICD-10-CM | POA: Diagnosis not present

## 2017-06-29 NOTE — Assessment & Plan Note (Signed)
Knee replacement in 2017

## 2017-06-29 NOTE — Assessment & Plan Note (Signed)
blood pressure control important in reducing the progression of atherosclerotic disease. On appropriate oral medications.  

## 2017-06-29 NOTE — Patient Instructions (Signed)
Pulmonary Embolism A pulmonary embolism (PE) is a sudden blockage or decrease of blood flow in one lung or both lungs. Most blockages come from a blood clot that forms in a lower leg, thigh, or arm vein (deep vein thrombosis, DVT) and travels to the lungs. A clot is blood that has thickened into a gel or solid. PE is a dangerous and life-threatening condition that needs to be treated right away. What are the causes? This condition is usually caused by a blood clot that forms in a vein and moves to the lungs. In rare cases, it may be caused by air, fat, part of a tumor, or other tissue that moves through the veins and into the lungs. What increases the risk? The following factors may make you more likely to develop this condition:  Having DVT or a history of DVT.  Being older than age 60.  Personal or family history of blood clots or blood clotting disease.  Major or lengthy surgery.  Orthopedic surgery, especially hip or knee replacement.  Traumatic injury, such as breaking a hip or leg.  Spinal cord injury.  Stroke.  Taking medicines that contain estrogen. These include birth control pills and hormone replacement therapy.  Long-term (chronic) lung or heart disease.  Cancer and chemotherapy.  Having a central venous catheter.  Pregnancy and the period after delivery.  What are the signs or symptoms? Symptoms of this condition usually start suddenly and include:  Shortness of breath while active or at rest.  Coughing or coughing up blood or blood-tinged mucus.  Chest pain that is often worse with deep breaths.  Rapid or irregular heartbeat.  Feeling light-headed or dizzy.  Fainting.  Feeling anxious.  Sweating.  Pain and swelling in a leg. This is a symptom of DVT, which can lead to PE.  How is this diagnosed? This condition may be diagnosed based on:  Your medical history.  A physical exam.  Blood tests to check blood oxygen level and how well your blood  clots, and a D-dimer blood test, which checks your blood for a substance that is released when a blood clot breaks apart.  CT pulmonary angiogram. This test checks blood flow in and around your lungs.  Ventilation-perfusion scan, also called a lung VQ scan. This test measures air flow and blood flow to the lungs.  Ultrasound of the legs to look for blood clots.  How is this treated? Treatment for this conditions depends on many factors, such as the cause of your PE, your risk for bleeding or developing more clots, and other medical conditions you have. Treatment aims to remove, dissolve, or stop blood clots from forming or growing larger. Treatment may include:  Blood thinning medicines (anticoagulants) to stop clots from forming or growing. These medicines may be given as a pill, as an injection, or through an IV tube (infusion).  Medicines that dissolve clots (thrombolytics).  A procedure in which a flexible tube is used to remove a blood clot (embolectomy) or deliver medicine to destroy it (catheter-directed thrombolysis).  A procedure in which a filter is inserted into a large vein that carries blood to the heart (inferior vena cava). This filter (vena cava filter) catches blood clots before they reach the lungs.  Surgery to remove the clot (surgical embolectomy). This is rare.  You may need a combination of immediate, long-term (up to 3 months after diagnosis), and extended (more than 3 months after diagnosis) treatments. Your treatment may continue for several months (maintenance therapy).   You and your health care provider will work together to choose the treatment program that is best for you. Follow these instructions at home: If you are taking an anticoagulant medicine:  Take the medicine every day at the same time each day.  Understand what foods and drugs interact with your medicine.  Understand the side effects of this medicine, including excessive bruising or bleeding. Ask  your health care provider or pharmacist about other side effects. General instructions  Take over-the-counter and prescription medicines only as told by your health care provider.  Anticoagulant medicines may cause side effects, including easy bruising and difficulty stopping bleeding. If you are prescribed an anticoagulant: ? Hold pressure over cuts for longer than usual. ? Tell your dentist and other health care providers that you are taking anticoagulants before you have any procedure that may cause bleeding. ? Avoid contact sports. ? Be extra careful when handling sharp objects. ? Use a soft toothbrush. Floss with waxed dental floss. ? Shave with an electric razor.  Wear a medical alert bracelet or carry a medical alert card that says you have had a PE.  Ask your health care provider when you may return to your normal activities.  Talk with your health care provider about any travel plans. It is important to make sure that you are still able to take your medicine while on trips.  Keep all follow-up visits as told by your health care provider. This is important. How is this prevented? Take these actions to lower your risk of developing another PE:  Exercise regularly. Take frequent walks. For at least 30 minutes every day, engage in: ? Activity that involves moving your arms and legs. ? Activity that encourages good blood flow through your body by increasing your heart rate.  While traveling, drink plenty of water and avoid drinking alcohol. Ask your health care provider if you should wear below-the-knee compression stockings.  Avoid sitting or lying in bed for long periods of time without moving your legs. Exercise your arms and legs every hour during long-distance travel (over 4 hours).  If you are hospitalized or have surgery, ask your health care provider about your risks and what treatments can help prevent blood clots.  Maintain a healthy weight. Ask your health care  provider what weight is healthy for you.  If you are a woman who is over age 35, avoid unnecessary use of medicines that contain estrogen, including birth control pills.  Do not use any products that contain nicotine or tobacco, such as cigarettes and e-cigarettes. This is especially important if you take estrogen medicines. If you need help quitting, ask your health care provider.  See your health care provider for regular checkups. This may include blood tests and ultrasound testing on your legs to check for new blood clots.  Contact a health care provider if:  You missed a dose of your blood thinner medicine. Get help right away if:  You have new or increased pain, swelling, warmth, or redness in an arm or leg.  You have numbness or tingling in an arm or leg.  You have shortness of breath while active or at rest.  You have chest pain.  You have a rapid or irregular heartbeat.  You feel light-headed or dizzy.  You cough up blood.  You have blood in your vomit, stool, or urine.  You have a fever.  You have abdomen (abdominal) pain.  You have a severe fall or head injury.  You have a   severe headache.  You have vision changes.  You cannot move your arms or legs.  You are confused or have memory loss.  You are bleeding for 10 minutes or more, even with strong pressure on the wound. These symptoms may represent a serious problem that is an emergency. Do not wait to see if the symptoms will go away. Get medical help right away. Call your local emergency services (911 in the [place] Do not drive yourself to the hospital. Summary  A pulmonary embolism (PE) is a sudden blockage or decrease of blood flow in one lung or both lungs. PE is a dangerous and life-threatening condition that needs to be treated right away.  Having deep vein thrombosis (DVT) or a history of DVT is the most common risk factor for PE.  Treatments for this condition usually include medicines to thin  your blood (anticoagulants) or medicines to break apart blood clots (thrombolytics).  If you are prescribed blood thinners, it is important to take the medicine every single day at the same time each day.  If you have signs of PE or DVT, call your local emergency services (911 in the [place] This information is not intended to replace advice given to you by your health care provider. Make sure you discuss any questions you have with your health care provider. Document Released: 04/21/2000 Document Revised: 05/27/2016 Document Reviewed: 05/27/2016 Elsevier Interactive Patient Education  2018 [employer] Inc[employer]  

## 2017-06-29 NOTE — Progress Notes (Signed)
Patient ID: Laura Zhang, female   DOB: 04-Feb-1946, 72 y.o.   MRN: [id number]  Chief Complaint  Patient presents with  . New Patient (Initial Visit)    discuss stopping anticoagulation    HPI Laura Zhang is a 72 y.o. female.  I am asked to see the patient by Dr. Jefm Bryant for evaluation of DVT to determine the duration of anticoagulation.  The patient reports having a knee replacement in 2017.  The day after her knee replacement, she developed a pulmonary embolus after developing chest pain shortness of breath this was diagnosed.  She was appropriately started on anticoagulation and maintained on anticoagulation for well over a year now.  She has not had any problems with anticoagulation.  She has some occasional pain and swelling in her leg but it is not severe.  She has no worrisome symptoms of a new thromboembolic event.  She denies chest pain or shortness of breath currently.  No fever or chills.     Past Medical History:  Diagnosis Date  . Anemia   . Arthritis   . Atrial fibrillation ([place])   . Constipation   . GERD (gastroesophageal reflux disease)   . Hemorrhoids   . History of pulmonary embolus (PE)   . Hyperlipidemia   . Hypertension   . Vitamin D deficiency     Past Surgical History:  Procedure Laterality Date  . ABDOMINAL HYSTERECTOMY    . CARPAL TUNNEL RELEASE Left   . ESOPHAGOGASTRODUODENOSCOPY (EGD) WITH PROPOFOL N/A 03/24/2015   Procedure: ESOPHAGOGASTRODUODENOSCOPY (EGD) WITH PROPOFOL;  Surgeon: Manya Silvas, MD;  Location: [facility] ENDOSCOPY;  Service: Endoscopy;  Laterality: N/A;  . great toe fusion Left   . JOINT REPLACEMENT     Left knee  . KNEE CLOSED REDUCTION Left 05/31/2016   Procedure: CLOSED MANIPULATION KNEE;  Surgeon: Leanor Kail, MD;  Location: [facility] ORS;  Service: Orthopedics;  Laterality: Left;    Family History  Problem Relation Age of Onset  . Breast cancer Neg Hx      Social History Social History   Tobacco Use  .  Smoking status: Never Smoker  . Smokeless tobacco: Never Used  Substance Use Topics  . Alcohol use: No  . Drug use: No     No Known Allergies  Current Outpatient Medications  Medication Sig Dispense Refill  . acetaminophen (TYLENOL) 650 MG CR tablet Take 650 mg by mouth every 8 (eight) hours as needed for pain.    Marland Kitchen apixaban (ELIQUIS) 5 MG TABS tablet Take 5 mg by mouth 2 (two) times daily.    Marland Kitchen aspirin 81 MG tablet Take 81 mg by mouth daily.    . calcium citrate-vitamin D (CITRACAL+D) 315-200 MG-UNIT tablet Take 1 tablet by mouth daily.    Marland Kitchen losartan (COZAAR) 50 MG tablet Take 50 mg by mouth daily.    Marland Kitchen lovastatin (MEVACOR) 10 MG tablet Take 10 mg by mouth at bedtime.    . pantoprazole (PROTONIX) 40 MG tablet Take 40 mg by mouth 2 (two) times daily before a meal.    . triamterene-hydrochlorothiazide (MAXZIDE-25) 37.5-25 MG tablet Take 1 tablet by mouth daily.    Marland Kitchen amLODipine (NORVASC) 10 MG tablet Take 10 mg by mouth daily.    . ondansetron (ZOFRAN) 4 MG tablet Take 4 mg by mouth every 8 (eight) hours as needed for nausea or vomiting.    . sucralfate (CARAFATE) 1 G tablet Take 1 g by mouth 4 (four) times daily -  with meals and at bedtime.     No current facility-administered medications for this visit.       REVIEW OF SYSTEMS (Negative unless checked)  Constitutional: [] Weight loss  [] Fever  [] Chills Cardiac: [] Chest pain   [] Chest pressure   [x] Palpitations   [] Shortness of breath when laying flat   [] Shortness of breath at rest   [] Shortness of breath with exertion. Vascular:  [] Pain in legs with walking   [] Pain in legs at rest   [] Pain in legs when laying flat   [] Claudication   [] Pain in feet when walking  [] Pain in feet at rest  [] Pain in feet when laying flat   [x] History of DVT   [] Phlebitis   [] Swelling in legs   [] Varicose veins   [] Non-healing ulcers Pulmonary:   [] Uses home oxygen   [] Productive cough   [] Hemoptysis   [] Wheeze  [] COPD   [] Asthma Neurologic:   [] Dizziness  [] Blackouts   [] Seizures   [] History of stroke   [] History of TIA  [] Aphasia   [] Temporary blindness   [] Dysphagia   [] Weakness or numbness in arms   [] Weakness or numbness in legs Musculoskeletal:  [x] Arthritis   [] Joint swelling   [] Joint pain   [] Low back pain Hematologic:  [] Easy bruising  [] Easy bleeding   [] Hypercoagulable state   [] Anemic  [] Hepatitis Gastrointestinal:  [] Blood in stool   [] Vomiting blood  [x] Gastroesophageal reflux/heartburn   [] Abdominal pain Genitourinary:  [] Chronic kidney disease   [] Difficult urination  [] Frequent urination  [] Burning with urination   [] Hematuria Skin:  [] Rashes   [] Ulcers   [] Wounds Psychological:  [] History of anxiety   []  History of major depression.    Physical Exam BP (!) 153/85 (BP Location: Right Arm)   Pulse 78   Resp 16   Ht 5' 4.5" (1.638 m)   Wt 87.5 kg (192 lb 12.8 oz)   BMI 32.58 kg/m  Gen:  WD/WN, NAD. Appears younger than stated age. Head: [place]/AT, No temporalis wasting.  Ear/Nose/Throat: Hearing grossly intact, nares w/o erythema or drainage, oropharynx w/o Erythema/Exudate Eyes: Conjunctiva clear, sclera non-icteric  Neck: trachea midline.  No JVD.  Pulmonary:  Good air movement, respirations not labored, no use of accessory muscles  Cardiac: irregularly irregular Vascular:  Vessel Right Left  Radial Palpable Palpable                          PT Palpable Palpable  DP Palpable Palpable    Musculoskeletal: M/S 5/5 throughout.  Extremities without ischemic changes.  No deformity or atrophy. No apprciable edema. Neurologic: Sensation grossly intact in extremities.  Symmetrical.  Speech is fluent. Motor exam as listed above. Psychiatric: Judgment intact, Mood & affect appropriate for pt's clinical situation. Dermatologic: No rashes or ulcers noted.  No cellulitis or open wounds.   Radiology No results found.  Labs No results found for this or any previous visit (from the past 2160  hour(s)).  Assessment/Plan:  Hypertension blood pressure control important in reducing the progression of atherosclerotic disease. On appropriate oral medications.   Arthritis Knee replacement in 2017  History of pulmonary embolus (PE) Has been treated appropriately with Eliquis for over one year now.  From a vascular standpoint, she can discontinue her anticoagulation and take an aspirin a day.  She does have a history of atrial fibrillation as well, and she should probably ensure that her primary care physician or cardiologist to not want to keep her on anticoagulation as well.  Her risk of thromboembolic issues in the future is higher than the general population, but not high enough to warrant long-term anticoagulation given a single provoked thromboembolic event well over a year ago.  She can wear compression stockings and elevate her legs for any pain or swelling from her previous DVT.  I will see her back as needed.      Leotis Pain 06/29/2017, 5:06 PM   This note was created with Dragon medical transcription system.  Any errors from dictation are unintentional.

## 2017-06-29 NOTE — Assessment & Plan Note (Signed)
Has been treated appropriately with Eliquis for over one year now.  From a vascular standpoint, she can discontinue her anticoagulation and take an aspirin a day.  She does have a history of atrial fibrillation as well, and she should probably ensure that her primary care physician or cardiologist to not want to keep her on anticoagulation as well.  Her risk of thromboembolic issues in the future is higher than the general population, but not high enough to warrant long-term anticoagulation given a single provoked thromboembolic event well over a year ago.  She can wear compression stockings and elevate her legs for any pain or swelling from her previous DVT.  I will see her back as needed.

## 2018-05-13 ENCOUNTER — Emergency Department
Admission: EM | Admit: 2018-05-13 | Discharge: 2018-05-13 | Disposition: A | Discharge status: Other Acute Inpatient Hospital | Payer: Medicare Other

## 2018-06-04 ENCOUNTER — Encounter: Payer: Self-pay | Admitting: *Deleted

## 2018-06-05 ENCOUNTER — Ambulatory Visit
Admission: RE | Admit: 2018-06-05 | Discharge: 2018-06-05 | Disposition: A | Discharge status: Home / Self Care | Payer: Medicare Other | Source: Ambulatory Visit | Attending: Unknown Physician Specialty | Admitting: Unknown Physician Specialty

## 2018-06-05 ENCOUNTER — Ambulatory Visit: Payer: Medicare Other | Admitting: Anesthesiology

## 2018-06-05 ENCOUNTER — Encounter
Admission: RE | Disposition: A | Discharge status: Home / Self Care | Payer: Self-pay | Source: Ambulatory Visit | Attending: Unknown Physician Specialty

## 2018-06-05 DIAGNOSIS — I4891 Unspecified atrial fibrillation: Secondary | ICD-10-CM | POA: Diagnosis not present

## 2018-06-05 DIAGNOSIS — I499 Cardiac arrhythmia, unspecified: Secondary | ICD-10-CM | POA: Diagnosis not present

## 2018-06-05 DIAGNOSIS — I509 Heart failure, unspecified: Secondary | ICD-10-CM | POA: Diagnosis not present

## 2018-06-05 DIAGNOSIS — Z86010 Personal history of colonic polyps: Secondary | ICD-10-CM | POA: Diagnosis not present

## 2018-06-05 DIAGNOSIS — Z7982 Long term (current) use of aspirin: Secondary | ICD-10-CM | POA: Diagnosis not present

## 2018-06-05 DIAGNOSIS — D649 Anemia, unspecified: Secondary | ICD-10-CM | POA: Diagnosis not present

## 2018-06-05 DIAGNOSIS — Z79899 Other long term (current) drug therapy: Secondary | ICD-10-CM | POA: Insufficient documentation

## 2018-06-05 DIAGNOSIS — Z96652 Presence of left artificial knee joint: Secondary | ICD-10-CM | POA: Diagnosis not present

## 2018-06-05 DIAGNOSIS — Z86711 Personal history of pulmonary embolism: Secondary | ICD-10-CM | POA: Diagnosis not present

## 2018-06-05 DIAGNOSIS — I110 Hypertensive heart disease with heart failure: Secondary | ICD-10-CM | POA: Insufficient documentation

## 2018-06-05 DIAGNOSIS — Z90710 Acquired absence of both cervix and uterus: Secondary | ICD-10-CM | POA: Insufficient documentation

## 2018-06-05 DIAGNOSIS — Z1211 Encounter for screening for malignant neoplasm of colon: Secondary | ICD-10-CM | POA: Insufficient documentation

## 2018-06-05 DIAGNOSIS — M1990 Unspecified osteoarthritis, unspecified site: Secondary | ICD-10-CM | POA: Insufficient documentation

## 2018-06-05 DIAGNOSIS — K640 First degree hemorrhoids: Secondary | ICD-10-CM | POA: Insufficient documentation

## 2018-06-05 DIAGNOSIS — E785 Hyperlipidemia, unspecified: Secondary | ICD-10-CM | POA: Insufficient documentation

## 2018-06-05 DIAGNOSIS — K219 Gastro-esophageal reflux disease without esophagitis: Secondary | ICD-10-CM | POA: Insufficient documentation

## 2018-06-05 DIAGNOSIS — K5900 Constipation, unspecified: Secondary | ICD-10-CM | POA: Diagnosis not present

## 2018-06-05 DIAGNOSIS — Z7901 Long term (current) use of anticoagulants: Secondary | ICD-10-CM | POA: Insufficient documentation

## 2018-06-05 DIAGNOSIS — D123 Benign neoplasm of transverse colon: Secondary | ICD-10-CM | POA: Insufficient documentation

## 2018-06-05 DIAGNOSIS — E559 Vitamin D deficiency, unspecified: Secondary | ICD-10-CM | POA: Insufficient documentation

## 2018-06-05 HISTORY — DX: Cardiac arrhythmia, unspecified: I49.9

## 2018-06-05 HISTORY — PX: COLONOSCOPY WITH PROPOFOL: SHX5780

## 2018-06-05 SURGERY — COLONOSCOPY WITH PROPOFOL
Anesthesia: General

## 2018-06-05 MED ORDER — SODIUM CHLORIDE 0.9 % IV SOLN: INTRAVENOUS | Status: DC

## 2018-06-05 MED ORDER — PROPOFOL 500 MG/50ML IV EMUL
INTRAVENOUS | Status: AC
  Filled 2018-06-05: qty 50

## 2018-06-05 MED ORDER — PIPERACILLIN-TAZOBACTAM 3.375 G IVPB
INTRAVENOUS | Status: AC
  Filled 2018-06-05: qty 50

## 2018-06-05 MED ORDER — PIPERACILLIN-TAZOBACTAM 3.375 G IVPB 30 MIN
3.3750 g | Freq: Once | INTRAVENOUS | Status: AC
Start: 2018-06-05 — End: 2018-06-05
  Administered 2018-06-05: 3.375 g via INTRAVENOUS
  Filled 2018-06-05: qty 50

## 2018-06-05 MED ORDER — SODIUM CHLORIDE 0.9 % IV SOLN
INTRAVENOUS | Status: DC
  Administered 2018-06-05: 08:00:00 via INTRAVENOUS

## 2018-06-05 MED ORDER — PROPOFOL 500 MG/50ML IV EMUL
INTRAVENOUS | Status: DC | PRN
  Administered 2018-06-05: 125 ug/kg/min via INTRAVENOUS

## 2018-06-05 MED ORDER — PROPOFOL 10 MG/ML IV BOLUS
INTRAVENOUS | Status: DC | PRN
  Administered 2018-06-05 (×2): 50 mg via INTRAVENOUS

## 2018-06-05 NOTE — Anesthesia Post-op Follow-up Note (Signed)
Anesthesia QCDR form completed.        

## 2018-06-05 NOTE — Op Note (Signed)
[facility] Gastroenterology Patient Name: Laura Zhang Procedure Date: 06/05/2018 8:25 AM MRN: [id number] Account #: [id number] Date of Birth: 03-01-1946 Admit Type: Outpatient Age: 73 Room: [facility] ENDO ROOM 3 Gender: Female Note Status: Finalized Procedure:            Colonoscopy Indications:          High risk colon cancer surveillance: Personal history                        of colonic polyps Providers:            Manya Silvas, MD Referring MD:         Sofie Hartigan (Referring MD) Medicines:            Propofol per Anesthesia Complications:        No immediate complications. Procedure:            Pre-Anesthesia Assessment:                       - After reviewing the risks and benefits, the patient                        was deemed in satisfactory condition to undergo the                        procedure.                       After obtaining informed consent, the colonoscope was                        passed under direct vision. Throughout the procedure,                        the patient's blood pressure, pulse, and oxygen                        saturations were monitored continuously. The                        Colonoscope was introduced through the anus and                        advanced to the the cecum, identified by appendiceal                        orifice and ileocecal valve. The colonoscopy was                        performed without difficulty. The patient tolerated the                        procedure well. The quality of the bowel preparation                        was good. Findings:      A diminutive polyp was found in the transverse colon. The polyp was       sessile. The polyp was removed with a jumbo cold forceps. Resection and       retrieval were complete.      Internal hemorrhoids were found during  endoscopy. The hemorrhoids were       small and Grade I (internal hemorrhoids that do not prolapse).      The exam was  otherwise without abnormality.      Multiple small-mouthed diverticula were found in the sigmoid colon. Impression:           - One diminutive polyp in the transverse colon, removed                        with a jumbo cold forceps. Resected and retrieved.                       - Internal hemorrhoids.                       - The examination was otherwise normal. Recommendation:       - Await pathology results. Manya Silvas, MD 06/05/2018 9:04:13 AM This report has been signed electronically. Number of Addenda: 0 Note Initiated On: 06/05/2018 8:25 AM Scope Withdrawal Time: 0 hours 7 minutes 39 seconds  Total Procedure Duration: 0 hours 16 minutes 39 seconds       [facility]

## 2018-06-05 NOTE — Anesthesia Postprocedure Evaluation (Signed)
Anesthesia Post Note  Patient: Laura Zhang  Procedure(s) Performed: COLONOSCOPY WITH PROPOFOL (N/A )  Patient location during evaluation: Endoscopy Anesthesia Type: General Level of consciousness: awake and alert Pain management: pain level controlled Vital Signs Assessment: post-procedure vital signs reviewed and stable Respiratory status: spontaneous breathing and respiratory function stable Cardiovascular status: stable Anesthetic complications: no     Last Vitals:  Vitals:   06/05/18 0811  BP: (!) 155/71  Pulse: 73  Resp: 20  Temp: (!) 36.2 C  SpO2: 100%    Last Pain:  Vitals:   06/05/18 0811  TempSrc: Tympanic  PainSc: 0-No pain                 Candace Begue K

## 2018-06-05 NOTE — H&P (Signed)
Primary Care Physician:  Sofie Hartigan, MD Primary Gastroenterologist:  Dr. Vira Agar  Pre-Procedure History & Physical: HPI:  Laura Zhang is a 73 y.o. female is here for an colonoscopy.   Past Medical History:  Diagnosis Date  . Anemia   . Arthritis   . Atrial fibrillation ([place])   . Constipation   . Dysrhythmia   . GERD (gastroesophageal reflux disease)   . Hemorrhoids   . History of pulmonary embolus (PE)   . Hyperlipidemia   . Hypertension   . Vitamin D deficiency     Past Surgical History:  Procedure Laterality Date  . ABDOMINAL HYSTERECTOMY    . CARPAL TUNNEL RELEASE Left   . COLONOSCOPY    . ESOPHAGOGASTRODUODENOSCOPY (EGD) WITH PROPOFOL N/A 03/24/2015   Procedure: ESOPHAGOGASTRODUODENOSCOPY (EGD) WITH PROPOFOL;  Surgeon: Manya Silvas, MD;  Location: [facility] ENDOSCOPY;  Service: Endoscopy;  Laterality: N/A;  . great toe fusion Left   . JOINT REPLACEMENT     Left knee  . KNEE CLOSED REDUCTION Left 05/31/2016   Procedure: CLOSED MANIPULATION KNEE;  Surgeon: Leanor Kail, MD;  Location: [facility] ORS;  Service: Orthopedics;  Laterality: Left;    Prior to Admission medications   Medication Sig Start Date End Date Taking? Authorizing Provider  aspirin 81 MG tablet Take 81 mg by mouth daily.   Yes [provider]  hydrALAZINE (APRESOLINE) 25 MG tablet Take 25 mg by mouth 2 (two) times daily at 10 AM and 5 PM.   Yes [provider]  losartan (COZAAR) 50 MG tablet Take 50 mg by mouth daily.   Yes [provider]  lovastatin (MEVACOR) 10 MG tablet Take 10 mg by mouth at bedtime.   Yes [provider]  metoprolol succinate (TOPROL-XL) 25 MG 24 hr tablet Take 12.5 mg by mouth daily.   Yes [provider]  pantoprazole (PROTONIX) 40 MG tablet Take 40 mg by mouth 2 (two) times daily before a meal.   Yes [provider]  triamterene-hydrochlorothiazide (MAXZIDE-25) 37.5-25 MG tablet Take 1 tablet by mouth daily.    Yes [provider]  acetaminophen (TYLENOL) 650 MG CR tablet Take 650 mg by mouth every 8 (eight) hours as needed for pain.    [provider]  amLODipine (NORVASC) 10 MG tablet Take 10 mg by mouth daily.    [provider]  apixaban (ELIQUIS) 5 MG TABS tablet Take 5 mg by mouth 2 (two) times daily.    [provider]  calcium citrate-vitamin D (CITRACAL+D) 315-200 MG-UNIT tablet Take 1 tablet by mouth daily.    [provider]  ondansetron (ZOFRAN) 4 MG tablet Take 4 mg by mouth every 8 (eight) hours as needed for nausea or vomiting.    [provider]  sucralfate (CARAFATE) 1 G tablet Take 1 g by mouth 4 (four) times daily -  with meals and at bedtime.    [provider]    Allergies as of 06/04/2018  . (No Known Allergies)    Family History  Problem Relation Age of Onset  . Breast cancer Neg Hx     Social History   Socioeconomic History  . Marital status: Married    Spouse name: Not on file  . Number of children: Not on file  . Years of education: Not on file  . Highest education level: Not on file  Occupational History  . Not on file  Social Needs  . Financial resource strain: Not on file  .  Food insecurity:    Worry: Not on file    Inability: Not on file  . Transportation needs:    Medical: Not on file    Non-medical: Not on file  Tobacco Use  . Smoking status: Never Smoker  . Smokeless tobacco: Never Used  Substance and Sexual Activity  . Alcohol use: No  . Drug use: No  . Sexual activity: Not on file  Lifestyle  . Physical activity:    Days per week: Not on file    Minutes per session: Not on file  . Stress: Not on file  Relationships  . Social connections:    Talks on phone: Not on file    Gets together: Not on file    Attends religious service: Not on file    Active member of club or organization: Not on file    Attends meetings of clubs or organizations: Not on file    Relationship status:  Not on file  . Intimate partner violence:    Fear of current or ex partner: Not on file    Emotionally abused: Not on file    Physically abused: Not on file    Forced sexual activity: Not on file  Other Topics Concern  . Not on file  Social History Narrative  . Not on file    Review of Systems: See HPI, otherwise negative ROS  Physical Exam: BP (!) 155/71   Pulse 73   Temp (!) 97.1 F (36.2 C) (Tympanic)   Resp 20   Ht 5' 4.5" (1.638 m)   Wt 81.6 kg   SpO2 100%   BMI 30.42 kg/m  General:   Alert,  pleasant and cooperative in NAD Head:  Normocephalic and atraumatic. Neck:  Supple; no masses or thyromegaly. Lungs:  Clear throughout to auscultation.    Heart:  Regular rate and rhythm. Abdomen:  Soft, nontender and nondistended. Normal bowel sounds, without guarding, and without rebound.   Neurologic:  Alert and  oriented x4;  grossly normal neurologically.  Impression/Plan: [name] is here for an colonoscopy to be performed for [facility] colon polyps.  Last colonoscopy was 10/15/2012 and 2 adenomatous polyps were removed  Risks, benefits, limitations, and alternatives regarding  colonoscopy have been reviewed with the patient.  Questions have been answered.  All parties agreeable.   Gaylyn Cheers, MD  06/05/2018, 8:28 AM

## 2018-06-05 NOTE — Transfer of Care (Signed)
Immediate Anesthesia Transfer of Care Note  Patient: Laura Zhang  Procedure(s) Performed: COLONOSCOPY WITH PROPOFOL (N/A )  Patient Location: PACU and Endoscopy Unit  Anesthesia Type:General  Level of Consciousness: awake, alert  and oriented  Airway & Oxygen Therapy: Patient Spontanous Breathing  Post-op Assessment: Report given to RN and Post -op Vital signs reviewed and stable  Post vital signs: Reviewed and stable  Last Vitals:  Vitals Value Taken Time  BP    Temp    Pulse 71 06/05/2018  9:07 AM  Resp 21 06/05/2018  9:07 AM  SpO2 99 % 06/05/2018  9:07 AM  Vitals shown include unvalidated device data.  Last Pain:  Vitals:   06/05/18 0811  TempSrc: Tympanic  PainSc: 0-No pain         Complications: No apparent anesthesia complications

## 2018-06-05 NOTE — Anesthesia Preprocedure Evaluation (Signed)
Anesthesia Evaluation  Patient identified by MRN, date of birth, ID band Patient awake    Reviewed: Allergy & Precautions, NPO status , Patient's Chart, lab work & pertinent test results, reviewed documented beta blocker date and time   History of Anesthesia Complications Negative for: history of anesthetic complications  Airway Mallampati: II       Dental  (+) Partial Lower, Partial Upper   Pulmonary neg sleep apnea, neg COPD,           Cardiovascular hypertension, Pt. on medications and Pt. on home beta blockers (-) Past MI and (-) CHF + dysrhythmias (hx of afib after PE, now in sr) Atrial Fibrillation (-) Valvular Problems/Murmurs     Neuro/Psych neg Seizures    GI/Hepatic Neg liver ROS, GERD  Medicated,  Endo/Other  neg diabetes  Renal/GU negative Renal ROS     Musculoskeletal   Abdominal   Peds  Hematology  (+) anemia ,   Anesthesia Other Findings   Reproductive/Obstetrics                             Anesthesia Physical Anesthesia Plan  ASA: III  Anesthesia Plan: General   Post-op Pain Management:    Induction: Intravenous  PONV Risk Score and Plan: 3 and Propofol infusion, TIVA and Midazolam  Airway Management Planned: Nasal Cannula  Additional Equipment:   Intra-op Plan:   Post-operative Plan:   Informed Consent: I have reviewed the patients History and Physical, chart, labs and discussed the procedure including the risks, benefits and alternatives for the proposed anesthesia with the patient or authorized representative who has indicated his/her understanding and acceptance.       Plan Discussed with:   Anesthesia Plan Comments:         Anesthesia Quick Evaluation

## 2018-06-06 ENCOUNTER — Encounter: Payer: Self-pay | Admitting: Unknown Physician Specialty

## 2018-06-06 LAB — SURGICAL PATHOLOGY

## 2019-03-06 ENCOUNTER — Other Ambulatory Visit: Payer: Self-pay | Admitting: Family Medicine

## 2019-03-06 DIAGNOSIS — Z1231 Encounter for screening mammogram for malignant neoplasm of breast: Secondary | ICD-10-CM

## 2019-06-05 ENCOUNTER — Ambulatory Visit
Admission: RE | Admit: 2019-06-05 | Discharge: 2019-06-05 | Disposition: A | Discharge status: Home / Self Care | Payer: Medicare Other | Source: Ambulatory Visit | Attending: Family Medicine | Admitting: Family Medicine

## 2019-06-05 ENCOUNTER — Other Ambulatory Visit: Payer: Self-pay

## 2019-06-05 DIAGNOSIS — Z1231 Encounter for screening mammogram for malignant neoplasm of breast: Secondary | ICD-10-CM | POA: Diagnosis not present

## 2019-06-09 ENCOUNTER — Other Ambulatory Visit: Payer: Self-pay | Admitting: Family Medicine

## 2019-06-09 DIAGNOSIS — R928 Other abnormal and inconclusive findings on diagnostic imaging of breast: Secondary | ICD-10-CM

## 2019-06-09 DIAGNOSIS — N6320 Unspecified lump in the left breast, unspecified quadrant: Secondary | ICD-10-CM

## 2019-06-20 ENCOUNTER — Ambulatory Visit
Admission: RE | Admit: 2019-06-20 | Discharge: 2019-06-20 | Disposition: A | Discharge status: Home / Self Care | Payer: Medicare Other | Source: Ambulatory Visit | Attending: Family Medicine | Admitting: Family Medicine

## 2019-06-20 DIAGNOSIS — N6320 Unspecified lump in the left breast, unspecified quadrant: Secondary | ICD-10-CM

## 2019-06-20 DIAGNOSIS — R928 Other abnormal and inconclusive findings on diagnostic imaging of breast: Secondary | ICD-10-CM

## 2019-07-22 ENCOUNTER — Other Ambulatory Visit: Payer: Self-pay

## 2019-07-22 ENCOUNTER — Other Ambulatory Visit
Admission: RE | Admit: 2019-07-22 | Discharge: 2019-07-22 | Disposition: A | Discharge status: Home / Self Care | Payer: Medicare Other | Source: Ambulatory Visit | Attending: Internal Medicine | Admitting: Internal Medicine

## 2019-07-22 DIAGNOSIS — Z20822 Contact with and (suspected) exposure to covid-19: Secondary | ICD-10-CM | POA: Diagnosis not present

## 2019-07-22 DIAGNOSIS — Z01812 Encounter for preprocedural laboratory examination: Secondary | ICD-10-CM | POA: Diagnosis present

## 2019-07-23 LAB — SARS CORONAVIRUS 2 (TAT 6-24 HRS): SARS Coronavirus 2: NEGATIVE

## 2019-07-24 ENCOUNTER — Ambulatory Visit: Payer: Medicare Other | Admitting: Certified Registered"

## 2019-07-24 ENCOUNTER — Encounter
Admission: RE | Disposition: A | Discharge status: Home / Self Care | Payer: Self-pay | Source: Home / Self Care | Attending: Internal Medicine

## 2019-07-24 ENCOUNTER — Other Ambulatory Visit: Payer: Self-pay

## 2019-07-24 ENCOUNTER — Encounter: Payer: Self-pay | Admitting: Internal Medicine

## 2019-07-24 ENCOUNTER — Ambulatory Visit
Admission: RE | Admit: 2019-07-24 | Discharge: 2019-07-24 | Disposition: A | Discharge status: Home / Self Care | Payer: Medicare Other | Attending: Internal Medicine | Admitting: Internal Medicine

## 2019-07-24 DIAGNOSIS — Z86711 Personal history of pulmonary embolism: Secondary | ICD-10-CM | POA: Insufficient documentation

## 2019-07-24 DIAGNOSIS — Z79899 Other long term (current) drug therapy: Secondary | ICD-10-CM | POA: Diagnosis not present

## 2019-07-24 DIAGNOSIS — K222 Esophageal obstruction: Secondary | ICD-10-CM | POA: Insufficient documentation

## 2019-07-24 DIAGNOSIS — I10 Essential (primary) hypertension: Secondary | ICD-10-CM | POA: Insufficient documentation

## 2019-07-24 DIAGNOSIS — K219 Gastro-esophageal reflux disease without esophagitis: Secondary | ICD-10-CM | POA: Insufficient documentation

## 2019-07-24 DIAGNOSIS — Z96652 Presence of left artificial knee joint: Secondary | ICD-10-CM | POA: Diagnosis not present

## 2019-07-24 DIAGNOSIS — Z7901 Long term (current) use of anticoagulants: Secondary | ICD-10-CM | POA: Diagnosis not present

## 2019-07-24 DIAGNOSIS — K449 Diaphragmatic hernia without obstruction or gangrene: Secondary | ICD-10-CM | POA: Diagnosis not present

## 2019-07-24 DIAGNOSIS — R1310 Dysphagia, unspecified: Secondary | ICD-10-CM | POA: Diagnosis present

## 2019-07-24 DIAGNOSIS — I4891 Unspecified atrial fibrillation: Secondary | ICD-10-CM | POA: Insufficient documentation

## 2019-07-24 DIAGNOSIS — E785 Hyperlipidemia, unspecified: Secondary | ICD-10-CM | POA: Insufficient documentation

## 2019-07-24 DIAGNOSIS — Z7982 Long term (current) use of aspirin: Secondary | ICD-10-CM | POA: Diagnosis not present

## 2019-07-24 HISTORY — PX: ESOPHAGOGASTRODUODENOSCOPY (EGD) WITH PROPOFOL: SHX5813

## 2019-07-24 SURGERY — ESOPHAGOGASTRODUODENOSCOPY (EGD) WITH PROPOFOL
Anesthesia: General

## 2019-07-24 MED ORDER — PROPOFOL 500 MG/50ML IV EMUL
INTRAVENOUS | Status: AC
  Filled 2019-07-24: qty 50

## 2019-07-24 MED ORDER — SODIUM CHLORIDE 0.9 % IV SOLN
INTRAVENOUS | Status: DC
  Administered 2019-07-24: 1000 mL via INTRAVENOUS

## 2019-07-24 MED ORDER — PROPOFOL 500 MG/50ML IV EMUL
INTRAVENOUS | Status: DC | PRN
  Administered 2019-07-24: 155 ug/kg/min via INTRAVENOUS

## 2019-07-24 MED ORDER — PROPOFOL 10 MG/ML IV BOLUS
INTRAVENOUS | Status: DC | PRN
  Administered 2019-07-24: 40 mg via INTRAVENOUS

## 2019-07-24 NOTE — Anesthesia Postprocedure Evaluation (Signed)
Anesthesia Post Note  Patient: Laura Zhang  Procedure(s) Performed: ESOPHAGOGASTRODUODENOSCOPY (EGD) WITH PROPOFOL (N/A )  Patient location during evaluation: Endoscopy Anesthesia Type: General Level of consciousness: awake and alert Pain management: pain level controlled Vital Signs Assessment: post-procedure vital signs reviewed and stable Respiratory status: spontaneous breathing, nonlabored ventilation, respiratory function stable and patient connected to nasal cannula oxygen Cardiovascular status: blood pressure returned to baseline and stable Postop Assessment: no apparent nausea or vomiting Anesthetic complications: no     Last Vitals:  Vitals:   07/24/19 0907 07/24/19 0917  BP: (!) 164/83 (!) 169/88  Pulse: 68 64  Resp: 14 15  Temp:    SpO2: 100% 100%    Last Pain:  Vitals:   07/24/19 0917  TempSrc:   PainSc: 0-No pain                 Precious Haws [place]

## 2019-07-24 NOTE — Transfer of Care (Signed)
Immediate Anesthesia Transfer of Care Note  Patient: Laura Zhang  Procedure(s) Performed: ESOPHAGOGASTRODUODENOSCOPY (EGD) WITH PROPOFOL (N/A )  Patient Location: PACU  Anesthesia Type:General  Level of Consciousness: awake, alert  and oriented  Airway & Oxygen Therapy: Patient Spontanous Breathing  Post-op Assessment: Report given to RN and Post -op Vital signs reviewed and stable  Post vital signs: Reviewed and stable  Last Vitals:  Vitals Value Taken Time  BP 121/74 07/24/19 0847  Temp 36.3 C 07/24/19 0847  Pulse 86 07/24/19 0849  Resp 15 07/24/19 0849  SpO2 100 % 07/24/19 0849  Vitals shown include unvalidated device data.  Last Pain:  Vitals:   07/24/19 0847  TempSrc: Temporal  PainSc: 0-No pain         Complications: No apparent anesthesia complications

## 2019-07-24 NOTE — H&P (Signed)
Outpatient short stay form Pre-procedure 07/24/2019 8:30 AM Yaniel Limbaugh K. Alice Reichert, M.D.  Primary Physician: Thereasa Distance, M.D.  Reason for visit:  Dysphagia, GERD  History of present illness:  Patient with several months of dysphagia and GERD symptoms refractory to BID PPI. Solid and liquid dysphagia with vomiting, hemetemesis, weight loss or food impaction.    Current Facility-Administered Medications:  .  0.9 %  sodium chloride infusion, , Intravenous, Continuous, [place], Benay Pike, MD, Last Rate: 20 mL/hr at 07/24/19 0801, 1,000 mL at 07/24/19 0801  Medications Prior to Admission  Medication Sig Dispense Refill Last Dose  . acetaminophen (TYLENOL) 650 MG CR tablet Take 650 mg by mouth every 8 (eight) hours as needed for pain.   Past Week at Unknown time  . apixaban (ELIQUIS) 5 MG TABS tablet Take 5 mg by mouth 2 (two) times daily.   Past Week at Unknown time  . aspirin 81 MG tablet Take 81 mg by mouth daily.   Past Week at Unknown time  . calcium citrate-vitamin D (CITRACAL+D) 315-200 MG-UNIT tablet Take 1 tablet by mouth daily.   Past Week at Unknown time  . hydrALAZINE (APRESOLINE) 25 MG tablet Take 25 mg by mouth 2 (two) times daily at 10 AM and 5 PM.   07/24/2019 at 0600  . losartan (COZAAR) 50 MG tablet Take 50 mg by mouth daily.   07/24/2019 at 0600  . lovastatin (MEVACOR) 10 MG tablet Take 10 mg by mouth at bedtime.   07/23/2019 at Unknown time  . ondansetron (ZOFRAN) 4 MG tablet Take 4 mg by mouth every 8 (eight) hours as needed for nausea or vomiting.   Past Week at Unknown time  . pantoprazole (PROTONIX) 40 MG tablet Take 40 mg by mouth 2 (two) times daily before a meal.   07/24/2019 at 0600  . sucralfate (CARAFATE) 1 G tablet Take 1 g by mouth 4 (four) times daily -  with meals and at bedtime.   Past Week at Unknown time  . amLODipine (NORVASC) 10 MG tablet Take 10 mg by mouth daily.     . metoprolol succinate (TOPROL-XL) 25 MG 24 hr tablet Take 12.5 mg by mouth daily.     Marland Kitchen  triamterene-hydrochlorothiazide (MAXZIDE-25) 37.5-25 MG tablet Take 1 tablet by mouth daily.        No Known Allergies   Past Medical History:  Diagnosis Date  . Anemia   . Arthritis   . Atrial fibrillation ([place])   . Constipation   . Dysrhythmia   . GERD (gastroesophageal reflux disease)   . Hemorrhoids   . History of pulmonary embolus (PE)   . Hyperlipidemia   . Hypertension   . Vitamin D deficiency     Review of systems:  Otherwise negative.    Physical Exam  Gen: Alert, oriented. Appears stated age.  HEENT: [place]/AT. PERRLA. Lungs: CTA, no wheezes. CV: RR nl S1, S2. Abd: soft, benign, no masses. BS+ Ext: No edema. Pulses 2+    Planned procedures: Proceed with EGD. The patient understands the nature of the planned procedure, indications, risks, alternatives and potential complications including but not limited to bleeding, infection, perforation, damage to internal organs and possible oversedation/side effects from anesthesia. The patient agrees and gives consent to proceed.  Please refer to procedure notes for findings, recommendations and patient disposition/instructions.     Kate Sweetman K. Alice Reichert, M.D. Gastroenterology 07/24/2019  8:30 AM

## 2019-07-24 NOTE — Op Note (Signed)
[facility] Gastroenterology Patient Name: Laura Zhang Procedure Date: 07/24/2019 7:25 AM MRN: [id number] Account #: [id number] Date of Birth: Jan 02, 1946 Admit Type: Outpatient Age: 74 Room: [facility] ENDO ROOM 4 Gender: Female Note Status: Finalized Procedure:             Upper GI endoscopy Indications:           Esophageal dysphagia, Gastro-esophageal reflux                         disease, Failure to respond to medical treatment Providers:             Benay Pike. Alice Reichert MD, MD Referring MD:          Sofie Hartigan (Referring MD) Medicines:             Propofol per Anesthesia Complications:         No immediate complications. Procedure:             Pre-Anesthesia Assessment:                        - The risks and benefits of the procedure and the                         sedation options and risks were discussed with the                         patient. All questions were answered and informed                         consent was obtained.                        - Patient identification and proposed procedure were                         verified prior to the procedure by the nurse. The                         procedure was verified in the procedure room.                        - ASA Grade Assessment: III - A patient with severe                         systemic disease.                        - After reviewing the risks and benefits, the patient                         was deemed in satisfactory condition to undergo the                         procedure.                        After obtaining informed consent, the endoscope was                         passed under direct vision.  Throughout the procedure,                         the patient's blood pressure, pulse, and oxygen                         saturations were monitored continuously. The Endoscope                         was introduced through the mouth, and advanced to the                         third  part of duodenum. The upper GI endoscopy was                         accomplished without difficulty. The patient tolerated                         the procedure poorly due to ineffective sedation. Findings:      One benign-appearing, intrinsic mild stenosis was found in the distal       esophagus. This stenosis measured 1.5 cm (inner diameter) x less than       one cm (in length). The stenosis was traversed. The scope was withdrawn.       Dilation was performed with a Maloney dilator with no resistance at 32       Fr.      Normal mucosa was found in the entire esophagus. Biopsies were obtained       from the proximal and distal esophagus with cold forceps for histology       of suspected eosinophilic esophagitis.      A 3 cm hiatal hernia was present.      The examined duodenum was normal.      The exam was otherwise without abnormality. Impression:            - Benign-appearing esophageal stenosis. Dilated.                        - Normal mucosa was found in the entire esophagus.                         Biopsied.                        - 3 cm hiatal hernia.                        - Normal examined duodenum.                        - The examination was otherwise normal. Recommendation:        - Patient has a contact number available for                         emergencies. The signs and symptoms of potential                         delayed complications were discussed with the patient.  Return to normal activities tomorrow. Written                         discharge instructions were provided to the patient.                        - Resume previous diet.                        - Continue present medications.                        - Await pathology results.                        - Return to nurse practitioner in 6 weeks.                        - Follow up with Stephens November, GI Nurse                         Practioner, in office to discuss results and  monitor                         progress.                        - The findings and recommendations were discussed with                         the patient. Procedure Code(s):     --- Professional ---                        [phone], Esophagogastroduodenoscopy, flexible,                         transoral; with biopsy, single or multiple                        43450, Dilation of esophagus, by unguided sound or                         bougie, single or multiple passes Diagnosis Code(s):     --- Professional ---                        K21.9, Gastro-esophageal reflux disease without                         esophagitis                        R13.14, Dysphagia, pharyngoesophageal phase                        K44.9, Diaphragmatic hernia without obstruction or                         gangrene                        K22.2, Esophageal obstruction CPT copyright 2019 American Medical Association. All rights reserved. The codes  documented in this report are preliminary and upon coder review may  be revised to meet current compliance requirements. Efrain Sella MD, MD 07/24/2019 8:45:30 AM This report has been signed electronically. Number of Addenda: 0 Note Initiated On: 07/24/2019 7:25 AM Estimated Blood Loss:  Estimated blood loss was minimal.      [facility]

## 2019-07-24 NOTE — Anesthesia Preprocedure Evaluation (Signed)
Anesthesia Evaluation  Patient identified by MRN, date of birth, ID band Patient awake    Reviewed: Allergy & Precautions, H&P , NPO status , Patient's Chart, lab work & pertinent test results  History of Anesthesia Complications Negative for: history of anesthetic complications  Airway Mallampati: III  TM Distance: >3 FB Neck ROM: limited    Dental  (+) Chipped, Poor Dentition, Missing   Pulmonary neg pulmonary ROS, neg shortness of breath,           Cardiovascular Exercise Tolerance: Good hypertension, (-) angina(-) Past MI and (-) DOE + dysrhythmias Atrial Fibrillation      Neuro/Psych negative neurological ROS  negative psych ROS   GI/Hepatic Neg liver ROS, GERD  Medicated and Controlled,  Endo/Other  negative endocrine ROS  Renal/GU negative Renal ROS  negative genitourinary   Musculoskeletal   Abdominal   Peds  Hematology negative hematology ROS (+)   Anesthesia Other Findings Past Medical History: No date: Anemia No date: Arthritis No date: Atrial fibrillation ([facility]) No date: Constipation No date: Dysrhythmia No date: GERD (gastroesophageal reflux disease) No date: Hemorrhoids No date: History of pulmonary embolus (PE) No date: Hyperlipidemia No date: Hypertension No date: Vitamin D deficiency  Past Surgical History: No date: ABDOMINAL HYSTERECTOMY No date: CARPAL TUNNEL RELEASE; Left No date: COLONOSCOPY 06/05/2018: COLONOSCOPY WITH PROPOFOL; N/A     Comment:  Procedure: COLONOSCOPY WITH PROPOFOL;  Surgeon: Manya Silvas, MD;  Location: [facility] ENDOSCOPY;  Service:               Endoscopy;  Laterality: N/A; 03/24/2015: ESOPHAGOGASTRODUODENOSCOPY (EGD) WITH PROPOFOL; N/A     Comment:  Procedure: ESOPHAGOGASTRODUODENOSCOPY (EGD) WITH               PROPOFOL;  Surgeon: Manya Silvas, MD;  Location: [facility]              ENDOSCOPY;  Service: Endoscopy;  Laterality: N/A; No date: great  toe fusion; Left No date: JOINT REPLACEMENT     Comment:  Left knee 05/31/2016: KNEE CLOSED REDUCTION; Left     Comment:  Procedure: CLOSED MANIPULATION KNEE;  Surgeon: Leanor Kail, MD;  Location: [facility] ORS;  Service: Orthopedics;              Laterality: Left;  BMI    Body Mass Index: 31.43 kg/m      Reproductive/Obstetrics negative OB ROS                             Anesthesia Physical Anesthesia Plan  ASA: III  Anesthesia Plan: General   Post-op Pain Management:    Induction: Intravenous  PONV Risk Score and Plan: Propofol infusion and TIVA  Airway Management Planned: Natural Airway and Nasal Cannula  Additional Equipment:   Intra-op Plan:   Post-operative Plan:   Informed Consent: I have reviewed the patients History and Physical, chart, labs and discussed the procedure including the risks, benefits and alternatives for the proposed anesthesia with the patient or authorized representative who has indicated his/her understanding and acceptance.     Dental Advisory Given  Plan Discussed with: Anesthesiologist, CRNA and Surgeon  Anesthesia Plan Comments: (Patient consented for risks of anesthesia including but not limited to:  - adverse reactions to medications - risk of intubation if required -  damage to teeth, lips or other oral mucosa - sore throat or hoarseness - Damage to heart, brain, lungs or loss of life  Patient voiced understanding.)        Anesthesia Quick Evaluation  

## 2019-07-24 NOTE — Interval H&P Note (Signed)
History and Physical Interval Note:  07/24/2019 8:31 AM  Laura Zhang  has presented today for surgery, with the diagnosis of DYSPHAGIA.  The various methods of treatment have been discussed with the patient and family. After consideration of risks, benefits and other options for treatment, the patient has consented to  Procedure(s): ESOPHAGOGASTRODUODENOSCOPY (EGD) WITH PROPOFOL (N/A) as a surgical intervention.  The patient's history has been reviewed, patient examined, no change in status, stable for surgery.  I have reviewed the patient's chart and labs.  Questions were answered to the patient's satisfaction.     [place], [place]

## 2019-07-25 ENCOUNTER — Encounter: Payer: Self-pay | Admitting: *Deleted

## 2019-07-25 LAB — SURGICAL PATHOLOGY

## 2020-08-25 ENCOUNTER — Telehealth (INDEPENDENT_AMBULATORY_CARE_PROVIDER_SITE_OTHER): Payer: Self-pay | Admitting: Vascular Surgery

## 2020-08-30 NOTE — Telephone Encounter (Signed)
For documentation

## 2020-09-07 ENCOUNTER — Encounter (INDEPENDENT_AMBULATORY_CARE_PROVIDER_SITE_OTHER): Payer: Medicare Other | Admitting: Vascular Surgery

## 2020-10-12 ENCOUNTER — Other Ambulatory Visit: Payer: Self-pay | Admitting: Surgery

## 2020-10-25 ENCOUNTER — Other Ambulatory Visit: Payer: Self-pay

## 2020-10-25 ENCOUNTER — Other Ambulatory Visit: Payer: Medicare Other

## 2020-10-25 ENCOUNTER — Encounter: Payer: Self-pay | Admitting: Surgery

## 2020-10-25 ENCOUNTER — Encounter
Admission: RE | Admit: 2020-10-25 | Discharge: 2020-10-25 | Disposition: A | Discharge status: Home / Self Care | Payer: Medicare Other | Source: Ambulatory Visit | Attending: Surgery | Admitting: Surgery

## 2020-10-25 DIAGNOSIS — Z01818 Encounter for other preprocedural examination: Secondary | ICD-10-CM | POA: Diagnosis present

## 2020-10-25 LAB — URINALYSIS, ROUTINE W REFLEX MICROSCOPIC
Bilirubin Urine: NEGATIVE
Glucose, UA: NEGATIVE mg/dL
Hgb urine dipstick: NEGATIVE
Ketones, ur: NEGATIVE mg/dL
Leukocytes,Ua: NEGATIVE
Nitrite: NEGATIVE
Protein, ur: NEGATIVE mg/dL
Specific Gravity, Urine: 1.003 — ABNORMAL LOW (ref 1.005–1.030)
pH: 8 (ref 5.0–8.0)

## 2020-10-25 LAB — CBC WITH DIFFERENTIAL/PLATELET
Abs Immature Granulocytes: 0.01 10*3/uL (ref 0.00–0.07)
Basophils Absolute: 0 10*3/uL (ref 0.0–0.1)
Basophils Relative: 1 %
Eosinophils Absolute: 0.1 10*3/uL (ref 0.0–0.5)
Eosinophils Relative: 5 %
HCT: 34.3 % — ABNORMAL LOW (ref 36.0–46.0)
Hemoglobin: 11.3 g/dL — ABNORMAL LOW (ref 12.0–15.0)
Immature Granulocytes: 0 %
Lymphocytes Relative: 28 %
Lymphs Abs: 0.9 10*3/uL (ref 0.7–4.0)
MCH: 27.9 pg (ref 26.0–34.0)
MCHC: 32.9 g/dL (ref 30.0–36.0)
MCV: 84.7 fL (ref 80.0–100.0)
Monocytes Absolute: 0.3 10*3/uL (ref 0.1–1.0)
Monocytes Relative: 11 %
Neutro Abs: 1.8 10*3/uL (ref 1.7–7.7)
Neutrophils Relative %: 55 %
Platelets: 231 10*3/uL (ref 150–400)
RBC: 4.05 MIL/uL (ref 3.87–5.11)
RDW: 13.2 % (ref 11.5–15.5)
WBC: 3.1 10*3/uL — ABNORMAL LOW (ref 4.0–10.5)
nRBC: 0 % (ref 0.0–0.2)

## 2020-10-25 LAB — TYPE AND SCREEN
ABO/RH(D): O POS
Antibody Screen: NEGATIVE

## 2020-10-25 LAB — COMPREHENSIVE METABOLIC PANEL
ALT: 16 U/L (ref 0–44)
AST: 22 U/L (ref 15–41)
Albumin: 4.3 g/dL (ref 3.5–5.0)
Alkaline Phosphatase: 54 U/L (ref 38–126)
Anion gap: 7 (ref 5–15)
BUN: 8 mg/dL (ref 8–23)
CO2: 26 mmol/L (ref 22–32)
Calcium: 9.4 mg/dL (ref 8.9–10.3)
Chloride: 101 mmol/L (ref 98–111)
Creatinine, Ser: 0.78 mg/dL (ref 0.44–1.00)
GFR, Estimated: >60 mL/min (ref >60)
Glucose, Bld: 142 mg/dL — ABNORMAL HIGH (ref 70–99)
Potassium: 3.3 mmol/L — ABNORMAL LOW (ref 3.5–5.1)
Sodium: 134 mmol/L — ABNORMAL LOW (ref 135–145)
Total Bilirubin: 1.2 mg/dL (ref 0.3–1.2)
Total Protein: 6.7 g/dL (ref 6.5–8.1)

## 2020-10-25 LAB — SURGICAL PCR SCREEN
MRSA, PCR: NEGATIVE
Staphylococcus aureus: NEGATIVE

## 2020-10-25 NOTE — Patient Instructions (Addendum)
Your procedure is scheduled on: 11/02/2020 Report to the Registration Desk on the 1st floor of the [facility]. To find out your arrival time, please call [phone] between 1PM - 3PM on: 11/02/2020  REMEMBER: Instructions that are not followed completely may result in serious medical risk, up to and including death; or upon the discretion of your surgeon and anesthesiologist your surgery may need to be rescheduled.  Do not eat food after midnight the night before surgery.  No gum chewing, lozengers or hard candies.  You may however, drink water up to 2 hours before you are scheduled to arrive for your surgery. Do not drink anything within 2 hours of your scheduled arrival time.   TAKE THESE MEDICATIONS THE MORNING OF SURGERY WITH A SIP OF WATER Hydralazine (APRESOLINE) 100 MG tablet Labetalol 200 MG Protonix (pantoprazole) 40 MG - (take one the night before and one on the morning of surgery - helps to prevent nausea after surgery.)   Please call your primary care provider for instructions regarding when to stop taking Eliquis. Dr. Ellison Hughs [phone]   One week prior to surgery: Stop Anti-inflammatories (NSAIDS) such as Advil, Aleve, Ibuprofen, Motrin, Naproxen, Naprosyn and Aspirin based products such as Excedrin, Goodys Powder, BC Powder. You may however, continue to take Tylenol if needed for pain up until the day of surgery. Stop ANY OVER THE COUNTER vitamins and supplements until after surgery.  No Alcohol for 24 hours before or after surgery.  No Smoking including e-cigarettes for 24 hours prior to surgery.  No chewable tobacco products for at least 6 hours prior to surgery.  No nicotine patches on the day of surgery.  Do not use any "recreational" drugs for at least a week prior to your surgery.  Please be advised that the combination of cocaine and anesthesia may have negative outcomes, up to and including death. If you test positive for cocaine, your surgery  will be cancelled.  On the morning of surgery brush your teeth with toothpaste and water, you may rinse your mouth with mouthwash if you wish. Do not swallow any toothpaste or mouthwash.  Do not wear jewelry, make-up, hairpins, clips or nail polish.  Do not wear lotions, powders, or perfumes.   Do not shave body from the neck down 48 hours prior to surgery just in case you cut yourself which could leave a site for infection.  Also, freshly shaved skin may become irritated if using the CHG soap.  Contact lenses, hearing aids and dentures may not be worn into surgery.  Do not bring valuables to the hospital. [facility] is not responsible for any missing/lost belongings or valuables.   Use CHG Soap or wipes as directed on instruction sheet.  Notify your doctor if there is any change in your medical condition (cold, fever, infection).  Wear comfortable clothing (specific to your surgery type) to the hospital.  After surgery, you can help prevent lung complications by doing breathing exercises.  Take deep breaths and cough every 1-2 hours. Your doctor may order a device called an Incentive Spirometer to help you take deep breaths. When coughing or sneezing, hold a pillow firmly against your incision with both hands. This is called "splinting." Doing this helps protect your incision. It also decreases belly discomfort.  If you are being admitted to the hospital overnight, leave your suitcase in the car. After surgery it may be brought to your room.  If you are being discharged the day of surgery, you will not  be allowed to drive home. You will need a responsible adult (18 years or older) to drive you home and stay with you that night.   If you are taking public transportation, you will need to have a responsible adult (18 years or older) with you. Please confirm with your physician that it is acceptable to use public transportation.   Please call the [facility] at  [phone] if you have any questions about these instructions.  Surgery Visitation Policy:  Patients undergoing a surgery or procedure may have one family member or support person with them as long as that person is not COVID-19 positive or experiencing its symptoms.  That person may remain in the waiting area during the procedure.  Inpatient Visitation:    Visiting hours are 7 a.m. to 8 p.m. Inpatients will be allowed two visitors daily. The visitors may change each day during the patient's stay. No visitors under the age of 27. Any visitor under the age of 17 must be accompanied by an adult. The visitor must pass COVID-19 screenings, use hand sanitizer when entering and exiting the patient's room and wear a mask at all times, including in the patient's room. Patients must also wear a mask when staff or their visitor are in the room. Masking is required regardless of vaccination status.

## 2020-10-29 ENCOUNTER — Other Ambulatory Visit
Admission: RE | Admit: 2020-10-29 | Discharge: 2020-10-29 | Disposition: A | Discharge status: Home / Self Care | Payer: Medicare Other | Source: Ambulatory Visit | Attending: Surgery | Admitting: Surgery

## 2020-10-29 ENCOUNTER — Other Ambulatory Visit: Payer: Self-pay

## 2020-10-29 DIAGNOSIS — Z01812 Encounter for preprocedural laboratory examination: Secondary | ICD-10-CM | POA: Diagnosis present

## 2020-10-29 DIAGNOSIS — Z20822 Contact with and (suspected) exposure to covid-19: Secondary | ICD-10-CM | POA: Diagnosis not present

## 2020-10-29 LAB — SARS CORONAVIRUS 2 (TAT 6-24 HRS): SARS Coronavirus 2: NEGATIVE

## 2020-11-02 ENCOUNTER — Inpatient Hospital Stay: Payer: Medicare Other | Admitting: Urgent Care

## 2020-11-02 ENCOUNTER — Inpatient Hospital Stay: Payer: Medicare Other

## 2020-11-02 ENCOUNTER — Encounter
Admission: RE | Disposition: A | Discharge status: Home Health | Payer: Self-pay | Source: Home / Self Care | Attending: Surgery

## 2020-11-02 ENCOUNTER — Inpatient Hospital Stay
Admission: RE | Admit: 2020-11-02 | Discharge: 2020-11-05 | DRG: 470 | Disposition: A | Discharge status: Home Health | Payer: Medicare Other | Attending: Surgery | Admitting: Surgery

## 2020-11-02 ENCOUNTER — Other Ambulatory Visit: Payer: Self-pay

## 2020-11-02 ENCOUNTER — Encounter: Payer: Self-pay | Admitting: Surgery

## 2020-11-02 DIAGNOSIS — Z7901 Long term (current) use of anticoagulants: Secondary | ICD-10-CM | POA: Diagnosis not present

## 2020-11-02 DIAGNOSIS — Z79899 Other long term (current) drug therapy: Secondary | ICD-10-CM | POA: Diagnosis not present

## 2020-11-02 DIAGNOSIS — Z86711 Personal history of pulmonary embolism: Secondary | ICD-10-CM

## 2020-11-02 DIAGNOSIS — M1711 Unilateral primary osteoarthritis, right knee: Principal | ICD-10-CM | POA: Diagnosis present

## 2020-11-02 DIAGNOSIS — Z8249 Family history of ischemic heart disease and other diseases of the circulatory system: Secondary | ICD-10-CM

## 2020-11-02 DIAGNOSIS — I4891 Unspecified atrial fibrillation: Secondary | ICD-10-CM | POA: Diagnosis present

## 2020-11-02 DIAGNOSIS — Z96651 Presence of right artificial knee joint: Secondary | ICD-10-CM

## 2020-11-02 DIAGNOSIS — K219 Gastro-esophageal reflux disease without esophagitis: Secondary | ICD-10-CM | POA: Diagnosis present

## 2020-11-02 DIAGNOSIS — E785 Hyperlipidemia, unspecified: Secondary | ICD-10-CM | POA: Diagnosis present

## 2020-11-02 DIAGNOSIS — Z8261 Family history of arthritis: Secondary | ICD-10-CM | POA: Diagnosis not present

## 2020-11-02 DIAGNOSIS — Z90710 Acquired absence of both cervix and uterus: Secondary | ICD-10-CM | POA: Diagnosis not present

## 2020-11-02 DIAGNOSIS — I10 Essential (primary) hypertension: Secondary | ICD-10-CM | POA: Diagnosis present

## 2020-11-02 DIAGNOSIS — Z20822 Contact with and (suspected) exposure to covid-19: Secondary | ICD-10-CM | POA: Diagnosis present

## 2020-11-02 DIAGNOSIS — M1990 Unspecified osteoarthritis, unspecified site: Secondary | ICD-10-CM | POA: Diagnosis present

## 2020-11-02 DIAGNOSIS — D649 Anemia, unspecified: Secondary | ICD-10-CM | POA: Diagnosis present

## 2020-11-02 DIAGNOSIS — R509 Fever, unspecified: Secondary | ICD-10-CM | POA: Diagnosis not present

## 2020-11-02 DIAGNOSIS — E559 Vitamin D deficiency, unspecified: Secondary | ICD-10-CM | POA: Diagnosis present

## 2020-11-02 HISTORY — PX: TOTAL KNEE ARTHROPLASTY: SHX125

## 2020-11-02 LAB — GLUCOSE, CAPILLARY
Glucose-Capillary: 139 mg/dL — ABNORMAL HIGH (ref 70–99)
Glucose-Capillary: 131 mg/dL — ABNORMAL HIGH (ref 70–99)

## 2020-11-02 LAB — ABO/RH: ABO/RH(D): O POS

## 2020-11-02 SURGERY — ARTHROPLASTY, KNEE, TOTAL
Anesthesia: Spinal | Site: Knee | Laterality: Right

## 2020-11-02 MED ORDER — TRANEXAMIC ACID 1000 MG/10ML IV SOLN
INTRAVENOUS | Status: AC
  Filled 2020-11-02: qty 20

## 2020-11-02 MED ORDER — SODIUM CHLORIDE 0.9 % IV SOLN
INTRAVENOUS | Status: DC | PRN
  Administered 2020-11-02: 60 mL

## 2020-11-02 MED ORDER — TRANEXAMIC ACID 1000 MG/10ML IV SOLN: INTRAVENOUS | Status: DC | PRN

## 2020-11-02 MED ORDER — FENTANYL CITRATE (PF) 100 MCG/2ML IJ SOLN
INTRAMUSCULAR | Status: AC
  Filled 2020-11-02: qty 2

## 2020-11-02 MED ORDER — SODIUM CHLORIDE 0.9 % IV SOLN: INTRAVENOUS | Status: DC

## 2020-11-02 MED ORDER — TRAMADOL HCL 50 MG PO TABS: 50.0000 mg | ORAL_TABLET | Freq: Four times a day (QID) | ORAL | Status: DC | PRN

## 2020-11-02 MED ORDER — ORAL CARE MOUTH RINSE: 15.0000 mL | Freq: Once | OROMUCOSAL | Status: AC

## 2020-11-02 MED ORDER — CEFAZOLIN SODIUM-DEXTROSE 2-4 GM/100ML-% IV SOLN
INTRAVENOUS | Status: AC
  Administered 2020-11-02: 2 g via INTRAVENOUS
  Filled 2020-11-02: qty 100

## 2020-11-02 MED ORDER — FENTANYL CITRATE (PF) 100 MCG/2ML IJ SOLN
25.0000 ug | INTRAMUSCULAR | Status: DC | PRN
  Administered 2020-11-02: 25 ug via INTRAVENOUS

## 2020-11-02 MED ORDER — PRAVASTATIN SODIUM 20 MG PO TABS
40.0000 mg | ORAL_TABLET | Freq: Every day | ORAL | Status: DC
  Administered 2020-11-02 – 2020-11-05 (×4): 40 mg via ORAL
  Filled 2020-11-02 (×4): qty 2

## 2020-11-02 MED ORDER — VITAMIN D 25 MCG (1000 UNIT) PO TABS
2000.0000 [IU] | ORAL_TABLET | Freq: Every morning | ORAL | Status: DC
  Administered 2020-11-03 – 2020-11-05 (×3): 2000 [IU] via ORAL
  Filled 2020-11-02 (×4): qty 2

## 2020-11-02 MED ORDER — FENTANYL CITRATE (PF) 100 MCG/2ML IJ SOLN
INTRAMUSCULAR | Status: DC | PRN
  Administered 2020-11-02 (×2): 25 ug via INTRAVENOUS

## 2020-11-02 MED ORDER — MAGNESIUM HYDROXIDE 400 MG/5ML PO SUSP
30.0000 mL | Freq: Every day | ORAL | Status: DC | PRN
  Administered 2020-11-05: 30 mL via ORAL
  Filled 2020-11-02: qty 30

## 2020-11-02 MED ORDER — KETOROLAC TROMETHAMINE 15 MG/ML IJ SOLN: 15.0000 mg | Freq: Once | INTRAMUSCULAR | Status: AC

## 2020-11-02 MED ORDER — HYDRALAZINE HCL 50 MG PO TABS
100.0000 mg | ORAL_TABLET | Freq: Two times a day (BID) | ORAL | Status: DC
  Administered 2020-11-02 – 2020-11-05 (×6): 100 mg via ORAL
  Filled 2020-11-02 (×6): qty 2

## 2020-11-02 MED ORDER — CHLORHEXIDINE GLUCONATE 0.12 % MT SOLN
OROMUCOSAL | Status: AC
  Filled 2020-11-02: qty 15

## 2020-11-02 MED ORDER — ONDANSETRON HCL 4 MG PO TABS: 4.0000 mg | ORAL_TABLET | Freq: Four times a day (QID) | ORAL | Status: DC | PRN

## 2020-11-02 MED ORDER — KETOROLAC TROMETHAMINE 15 MG/ML IJ SOLN
7.5000 mg | Freq: Four times a day (QID) | INTRAMUSCULAR | Status: AC
  Administered 2020-11-02 – 2020-11-03 (×4): 7.5 mg via INTRAVENOUS
  Filled 2020-11-02 (×4): qty 1

## 2020-11-02 MED ORDER — BUPIVACAINE-EPINEPHRINE (PF) 0.5% -1:200000 IJ SOLN
INTRAMUSCULAR | Status: AC
  Filled 2020-11-02: qty 90

## 2020-11-02 MED ORDER — OXYCODONE HCL 5 MG PO TABS
5.0000 mg | ORAL_TABLET | ORAL | Status: DC | PRN
  Administered 2020-11-02 – 2020-11-03 (×3): 5 mg via ORAL
  Administered 2020-11-04: 10 mg via ORAL
  Administered 2020-11-05 (×2): 5 mg via ORAL
  Filled 2020-11-02 (×7): qty 1

## 2020-11-02 MED ORDER — BUPIVACAINE-EPINEPHRINE (PF) 0.5% -1:200000 IJ SOLN
INTRAMUSCULAR | Status: DC | PRN
  Administered 2020-11-02: 30 mL via PERINEURAL

## 2020-11-02 MED ORDER — ONDANSETRON HCL 4 MG/2ML IJ SOLN: 4.0000 mg | Freq: Once | INTRAMUSCULAR | Status: DC | PRN

## 2020-11-02 MED ORDER — METOCLOPRAMIDE HCL 10 MG PO TABS: 5.0000 mg | ORAL_TABLET | Freq: Three times a day (TID) | ORAL | Status: DC | PRN

## 2020-11-02 MED ORDER — DIPHENHYDRAMINE HCL 12.5 MG/5ML PO ELIX
12.5000 mg | ORAL_SOLUTION | ORAL | Status: DC | PRN
Start: 2020-11-02 — End: 2020-11-06
  Filled 2020-11-02: qty 10

## 2020-11-02 MED ORDER — TRANEXAMIC ACID 1000 MG/10ML IV SOLN
INTRAVENOUS | Status: DC | PRN
  Administered 2020-11-02: 2000 mg via TOPICAL

## 2020-11-02 MED ORDER — POLYVINYL ALCOHOL 1.4 % OP SOLN
1.0000 [drp] | OPHTHALMIC | Status: DC | PRN
  Filled 2020-11-02: qty 15

## 2020-11-02 MED ORDER — APIXABAN 5 MG PO TABS
5.0000 mg | ORAL_TABLET | Freq: Two times a day (BID) | ORAL | Status: DC
  Administered 2020-11-03 – 2020-11-05 (×5): 5 mg via ORAL
  Filled 2020-11-02 (×5): qty 1

## 2020-11-02 MED ORDER — KETOROLAC TROMETHAMINE 15 MG/ML IJ SOLN
INTRAMUSCULAR | Status: AC
  Administered 2020-11-02: 15 mg via INTRAVENOUS
  Filled 2020-11-02: qty 1

## 2020-11-02 MED ORDER — MIDAZOLAM HCL 5 MG/5ML IJ SOLN
INTRAMUSCULAR | Status: DC | PRN
  Administered 2020-11-02: 1 mg via INTRAVENOUS

## 2020-11-02 MED ORDER — ACETAMINOPHEN 325 MG PO TABS
325.0000 mg | ORAL_TABLET | Freq: Four times a day (QID) | ORAL | Status: DC | PRN
  Administered 2020-11-05 (×2): 325 mg via ORAL
  Filled 2020-11-02 (×2): qty 1

## 2020-11-02 MED ORDER — MIDAZOLAM HCL 2 MG/2ML IJ SOLN
INTRAMUSCULAR | Status: AC
  Filled 2020-11-02: qty 2

## 2020-11-02 MED ORDER — LABETALOL HCL 200 MG PO TABS
200.0000 mg | ORAL_TABLET | Freq: Two times a day (BID) | ORAL | Status: DC
  Administered 2020-11-02 – 2020-11-05 (×6): 200 mg via ORAL
  Filled 2020-11-02 (×8): qty 1

## 2020-11-02 MED ORDER — PROPOFOL 10 MG/ML IV BOLUS
INTRAVENOUS | Status: AC
  Filled 2020-11-02: qty 20

## 2020-11-02 MED ORDER — METOCLOPRAMIDE HCL 5 MG/ML IJ SOLN: 5.0000 mg | Freq: Three times a day (TID) | INTRAMUSCULAR | Status: DC | PRN

## 2020-11-02 MED ORDER — PHENYLEPHRINE HCL (PRESSORS) 10 MG/ML IV SOLN
INTRAVENOUS | Status: AC
  Filled 2020-11-02: qty 1

## 2020-11-02 MED ORDER — SODIUM CHLORIDE FLUSH 0.9 % IV SOLN
INTRAVENOUS | Status: AC
  Filled 2020-11-02: qty 60

## 2020-11-02 MED ORDER — ONDANSETRON HCL 4 MG/2ML IJ SOLN
INTRAMUSCULAR | Status: DC | PRN
  Administered 2020-11-02: 4 mg via INTRAVENOUS

## 2020-11-02 MED ORDER — BISACODYL 10 MG RE SUPP
10.0000 mg | Freq: Every day | RECTAL | Status: DC | PRN
  Administered 2020-11-05: 10 mg via RECTAL
  Filled 2020-11-02: qty 1

## 2020-11-02 MED ORDER — HYDROMORPHONE HCL 1 MG/ML IJ SOLN
0.2500 mg | INTRAMUSCULAR | Status: DC | PRN
Start: 2020-11-02 — End: 2020-11-06
  Administered 2020-11-04: 0.5 mg via INTRAVENOUS
  Filled 2020-11-02: qty 1

## 2020-11-02 MED ORDER — PHENYLEPHRINE HCL (PRESSORS) 10 MG/ML IV SOLN
INTRAVENOUS | Status: DC | PRN
  Administered 2020-11-02 (×2): 200 ug via INTRAVENOUS
  Administered 2020-11-02: 100 ug via INTRAVENOUS

## 2020-11-02 MED ORDER — PANTOPRAZOLE SODIUM 40 MG PO TBEC
40.0000 mg | DELAYED_RELEASE_TABLET | Freq: Every morning | ORAL | Status: DC
  Administered 2020-11-03 – 2020-11-05 (×3): 40 mg via ORAL
  Filled 2020-11-02 (×3): qty 1

## 2020-11-02 MED ORDER — PROPOFOL 500 MG/50ML IV EMUL
INTRAVENOUS | Status: DC | PRN
  Administered 2020-11-02: 50 ug/kg/min via INTRAVENOUS
  Administered 2020-11-02: 4100 ug via INTRAVENOUS

## 2020-11-02 MED ORDER — CEFAZOLIN SODIUM-DEXTROSE 2-4 GM/100ML-% IV SOLN
2.0000 g | Freq: Four times a day (QID) | INTRAVENOUS | Status: AC
  Administered 2020-11-02 (×2): 2 g via INTRAVENOUS
  Filled 2020-11-02 (×3): qty 100

## 2020-11-02 MED ORDER — ONDANSETRON HCL 4 MG/2ML IJ SOLN
4.0000 mg | Freq: Four times a day (QID) | INTRAMUSCULAR | Status: DC | PRN
  Administered 2020-11-03: 4 mg via INTRAVENOUS
  Filled 2020-11-02: qty 2

## 2020-11-02 MED ORDER — FENTANYL CITRATE (PF) 100 MCG/2ML IJ SOLN
INTRAMUSCULAR | Status: AC
  Administered 2020-11-02: 25 ug via INTRAVENOUS
  Filled 2020-11-02: qty 2

## 2020-11-02 MED ORDER — FLEET ENEMA 7-19 GM/118ML RE ENEM: 1.0000 | ENEMA | Freq: Once | RECTAL | Status: DC | PRN

## 2020-11-02 MED ORDER — SODIUM CHLORIDE 0.9 % IR SOLN
Status: DC | PRN
  Administered 2020-11-02: 3000 mL

## 2020-11-02 MED ORDER — BUPIVACAINE HCL (PF) 0.5 % IJ SOLN
INTRAMUSCULAR | Status: DC | PRN
  Administered 2020-11-02: 2.6 mL via INTRATHECAL

## 2020-11-02 MED ORDER — ACETAMINOPHEN 500 MG PO TABS
1000.0000 mg | ORAL_TABLET | Freq: Four times a day (QID) | ORAL | Status: AC
  Administered 2020-11-02 – 2020-11-03 (×4): 1000 mg via ORAL
  Filled 2020-11-02 (×4): qty 2

## 2020-11-02 MED ORDER — CHLORHEXIDINE GLUCONATE 0.12 % MT SOLN
15.0000 mL | Freq: Once | OROMUCOSAL | Status: AC
  Administered 2020-11-02: 15 mL via OROMUCOSAL

## 2020-11-02 MED ORDER — PROPOFOL 1000 MG/100ML IV EMUL
INTRAVENOUS | Status: AC
  Filled 2020-11-02: qty 100

## 2020-11-02 MED ORDER — CHLORTHALIDONE 25 MG PO TABS
25.0000 mg | ORAL_TABLET | Freq: Every morning | ORAL | Status: DC
  Administered 2020-11-03 – 2020-11-05 (×3): 25 mg via ORAL
  Filled 2020-11-02 (×5): qty 1

## 2020-11-02 MED ORDER — DOCUSATE SODIUM 100 MG PO CAPS
100.0000 mg | ORAL_CAPSULE | Freq: Two times a day (BID) | ORAL | Status: DC
  Administered 2020-11-02 – 2020-11-05 (×6): 100 mg via ORAL
  Filled 2020-11-02 (×6): qty 1

## 2020-11-02 MED ORDER — LOSARTAN POTASSIUM 50 MG PO TABS
100.0000 mg | ORAL_TABLET | Freq: Every morning | ORAL | Status: DC
  Administered 2020-11-03 – 2020-11-05 (×3): 100 mg via ORAL
  Filled 2020-11-02 (×3): qty 2

## 2020-11-02 MED ORDER — PROPYLENE GLYCOL 0.6 % OP SOLN: 1.0000 [drp] | Freq: Every day | OPHTHALMIC | Status: DC | PRN

## 2020-11-02 MED ORDER — CEFAZOLIN SODIUM-DEXTROSE 2-4 GM/100ML-% IV SOLN
2.0000 g | INTRAVENOUS | Status: AC
  Administered 2020-11-02: 2 g via INTRAVENOUS

## 2020-11-02 MED ORDER — BUPIVACAINE LIPOSOME 1.3 % IJ SUSP
INTRAMUSCULAR | Status: AC
  Filled 2020-11-02: qty 40

## 2020-11-02 SURGICAL SUPPLY — 62 items
APL PRP STRL LF DISP 70% ISPRP (MISCELLANEOUS) ×1
BIT DRILL QUICK REL 1/8 2PK SL (DRILL) IMPLANT
BLADE SAW SAG 25X90X1.19 (BLADE) ×2 IMPLANT
BLADE SURG SZ20 CARB STEEL (BLADE) ×2 IMPLANT
BNDG CMPR STD VLCR NS LF 5.8X6 (GAUZE/BANDAGES/DRESSINGS) ×1
BNDG ELASTIC 6X5.8 VLCR NS LF (GAUZE/BANDAGES/DRESSINGS) ×2 IMPLANT
BRNG TIB 67X10 ANT STAB MDLR (Insert) ×1 IMPLANT
CEMENT BONE R 1X40 (Cement) ×4 IMPLANT
CEMENT VACUUM MIXING SYSTEM (MISCELLANEOUS) ×2 IMPLANT
CHLORAPREP W/TINT 26 (MISCELLANEOUS) ×2 IMPLANT
COMP PAT 3 PEG SERIES A 31/6.2 (Miscellaneous) ×2 IMPLANT
COMPONENT PAT3 PEG SERS 31/6.2 (Miscellaneous) IMPLANT
COOLER POLAR GLACIER W/PUMP (MISCELLANEOUS) ×2 IMPLANT
COVER MAYO STAND REUSABLE (DRAPES) ×2 IMPLANT
COVER WAND RF STERILE (DRAPES) ×2 IMPLANT
CUFF TOURN SGL QUICK 34 (TOURNIQUET CUFF) ×2
CUFF TRNQT CYL 34X4.125X (TOURNIQUET CUFF) IMPLANT
DRAPE 3/4 80X56 (DRAPES) ×2 IMPLANT
DRAPE IMP U-DRAPE 54X76 (DRAPES) ×2 IMPLANT
DRILL QUICK RELEASE 1/8 INCH (DRILL) ×2
DRSG OPSITE POSTOP 4X10 (GAUZE/BANDAGES/DRESSINGS) ×2 IMPLANT
DRSG OPSITE POSTOP 4X8 (GAUZE/BANDAGES/DRESSINGS) ×2 IMPLANT
ELECT REM PT RETURN 9FT ADLT (ELECTROSURGICAL) ×2
ELECTRODE REM PT RTRN 9FT ADLT (ELECTROSURGICAL) ×1 IMPLANT
GAUZE 4X4 16PLY ~~LOC~~+RFID DBL (SPONGE) ×2 IMPLANT
GAUZE XEROFORM 1X8 LF (GAUZE/BANDAGES/DRESSINGS) ×2 IMPLANT
GLOVE SRG 8 PF TXTR STRL LF DI (GLOVE) ×1 IMPLANT
GLOVE SURG ENC MOIS LTX SZ7.5 (GLOVE) ×8 IMPLANT
GLOVE SURG ENC MOIS LTX SZ8 (GLOVE) ×8 IMPLANT
GLOVE SURG UNDER LTX SZ8 (GLOVE) ×2 IMPLANT
GLOVE SURG UNDER POLY LF SZ8 (GLOVE) ×2
GOWN STRL REUS W/ TWL LRG LVL3 (GOWN DISPOSABLE) ×1 IMPLANT
GOWN STRL REUS W/ TWL XL LVL3 (GOWN DISPOSABLE) ×1 IMPLANT
GOWN STRL REUS W/TWL LRG LVL3 (GOWN DISPOSABLE) ×2
GOWN STRL REUS W/TWL XL LVL3 (GOWN DISPOSABLE) ×2
HOOD PEEL AWAY FLYTE STAYCOOL (MISCELLANEOUS) ×6 IMPLANT
INSERT TIB BEARING 67X10 (Insert) ×1 IMPLANT
IV NS IRRIG 3000ML ARTHROMATIC (IV SOLUTION) ×2 IMPLANT
KIT TURNOVER KIT A (KITS) ×2 IMPLANT
KNEE CR FEMORAL RT 65MM (Femur) ×1 IMPLANT
MANIFOLD NEPTUNE II (INSTRUMENTS) ×2 IMPLANT
NDL SPNL 20GX3.5 QUINCKE YW (NEEDLE) ×1 IMPLANT
NEEDLE SPNL 20GX3.5 QUINCKE YW (NEEDLE) ×2 IMPLANT
NS IRRIG 1000ML POUR BTL (IV SOLUTION) ×2 IMPLANT
PACK TOTAL KNEE (MISCELLANEOUS) ×2 IMPLANT
PAD WRAPON POLAR KNEE (MISCELLANEOUS) ×1 IMPLANT
PENCIL SMOKE EVACUATOR (MISCELLANEOUS) ×2 IMPLANT
PLATE INTERLOK 6700 (Plate) ×1 IMPLANT
PULSAVAC PLUS IRRIG FAN TIP (DISPOSABLE) ×2
SPONGE T-LAP 18X18 ~~LOC~~+RFID (SPONGE) ×6 IMPLANT
STAPLER SKIN PROX 35W (STAPLE) ×2 IMPLANT
SUCTION FRAZIER HANDLE 10FR (MISCELLANEOUS) ×2
SUCTION TUBE FRAZIER 10FR DISP (MISCELLANEOUS) ×1 IMPLANT
SUT VIC AB 0 CT1 36 (SUTURE) ×6 IMPLANT
SUT VIC AB 2-0 CT1 27 (SUTURE) ×8
SUT VIC AB 2-0 CT1 TAPERPNT 27 (SUTURE) ×3 IMPLANT
SYR 10ML LL (SYRINGE) ×2 IMPLANT
SYR 20ML LL LF (SYRINGE) ×2 IMPLANT
SYR 30ML LL (SYRINGE) ×1 IMPLANT
TIP FAN IRRIG PULSAVAC PLUS (DISPOSABLE) ×1 IMPLANT
TRAP FLUID SMOKE EVACUATOR (MISCELLANEOUS) ×2 IMPLANT
WRAPON POLAR PAD KNEE (MISCELLANEOUS) ×2

## 2020-11-02 NOTE — Anesthesia Procedure Notes (Signed)
Spinal  Patient location during procedure: OR Start time: 11/02/2020 7:40 AM End time: 11/02/2020 7:45 AM Reason for block: surgical anesthesia Staffing Performed: anesthesiologist  Anesthesiologist: Emmie Niemann, MD Preanesthetic Checklist Completed: patient identified, IV checked, site marked, risks and benefits discussed, surgical consent, monitors and equipment checked, pre-op evaluation and timeout performed Spinal Block Patient position: sitting Prep: ChloraPrep Patient monitoring: heart rate, continuous pulse ox and blood pressure Approach: midline Location: L4-5 Injection technique: single-shot Needle Needle type: Introducer and Pencil-Tip  Needle gauge: 24 G Needle length: 9 cm Assessment Events: CSF return Additional Notes Negative paresthesia. Negative blood return. Positive free-flowing CSF. Expiration date of kit checked and confirmed. Patient tolerated procedure well, without complications.

## 2020-11-02 NOTE — Op Note (Signed)
11/02/2020  10:19 AM  Patient:   Laura Zhang  Pre-Op Diagnosis:   Degenerative joint disease, right knee.  Post-Op Diagnosis:   Same  Procedure:   Right TKA using all-cemented [employer] Vanguard system with a 65 mm PCR femur, a 67 mm tibial tray with a 10 mm anterior stabilized E-poly insert, and a 31 x 6.2 mm all-poly 3-pegged domed patella.  Surgeon:   Pascal Lux, MD  Assistant:   Cameron Proud, PA-C   Anesthesia:   Spinal  Findings:   As above  Complications:   None  EBL:   10 cc  Fluids:   800 cc crystalloid  UOP:   None  TT:   100 minutes at 300 mmHg  Drains:   None  Closure:   Staples  Implants:   As above  Brief Clinical Note:   The patient is a 75 year old female with a long history of progressively worsening right knee pain. The patient's symptoms have progressed despite medications, activity modification, injections, etc. The patient's history and examination were consistent with advanced degenerative joint disease of the right knee confirmed by plain radiographs. The patient presents at this time for a right total knee arthroplasty.  Procedure:   The patient was brought into the operating room. After adequate spinal anesthesia was obtained, the patient was lain in the supine position before the right lower extremity was prepped with ChloraPrep solution and draped sterilely. Preoperative antibiotics were administered. After verifying the proper laterality with a surgical timeout, the limb was exsanguinated with an Esmarch and the tourniquet inflated to 300 mmHg.   A standard anterior approach to the knee was made through an approximately 7 inch incision. The incision was carried down through the subcutaneous tissues to expose superficial retinaculum. This was split the length of the incision and the medial flap elevated sufficiently to expose the medial retinaculum. The medial retinaculum was incised, leaving a 3-4 mm cuff of tissue on the patella. This was  extended distally along the medial border of the patellar tendon and proximally through the medial third of the quadriceps tendon. A subtotal fat pad excision was performed before the soft tissues were elevated off the anteromedial and anterolateral aspects of the proximal tibia to the level of the collateral ligaments. The anterior portions of the medial and lateral menisci were removed, as was the anterior cruciate ligament. With the knee flexed to 90, the external tibial guide was positioned and the appropriate proximal tibial cut made. This piece was taken to the back table where it was measured and found to be optimally replicated by a 67 mm component.  Attention was directed to the distal femur. The intramedullary canal was accessed through a 3/8" drill hole. The intramedullary guide was inserted and positioned in order to obtain a neutral flexion gap. The intercondylar block was positioned with care taken to avoid notching the anterior cortex of the femur. The appropriate cut was made. Next, the distal cutting block was placed at 5 of valgus alignment. Using the 9 mm slot, the distal cut was made. The distal femur was measured and found to be optimally replicated by the 65 mm component. The 65 mm 4-in-1 cutting block was positioned and first the posterior, then the posterior chamfer, the anterior chamfer, and finally the anterior cuts were made. At this point, the posterior portions medial and lateral menisci were removed. A   trial reduction was performed using the appropriate femoral and tibial components with the 10 mm insert. This  demonstrated excellent stability to varus and valgus stressing both in flexion and extension while permitting full extension. Patella tracking was assessed and found to be excellent. Therefore, the tibial guide position was marked on the proximal tibia. The patella thickness was measured and found to be 19 mm. Therefore, the appropriate cut was made. The patellar surface  was measured and found to be optimally replicated by the 31 mm component. The three peg holes were drilled in place before the trial button was inserted. Patella tracking was assessed and found to be excellent, passing the "no thumb test". The lug holes were drilled into the distal femur before the trial component was removed, leaving only the tibial tray. The keel was then created using the appropriate tower, reamer, and punch.  The bony surfaces were prepared for cementing by irrigating them thoroughly with sterile saline solution via the jet lavage system. A bone plug was fashioned from some of the bone that had been removed previously and used to plug the distal femoral canal. In addition, 20 cc of Exparel diluted out to 60 cc with normal saline and 30 cc of 0.5% Sensorcaine were injected into the postero-medial and postero-lateral aspects of the knee, the medial and lateral gutter regions, and the peri-incisional tissues to help with postoperative analgesia. Meanwhile, the cement was being mixed on the back table. When it was ready, the tibial tray was cemented in first. The excess cement was removed using [employer] fast streamer. Next, the femoral component was impacted into place. Again, the excess cement was removed using [employer] fast streamer. The 10 mm trial insert was positioned and the knee brought into extension while the cement hardened. Finally, the patella was cemented into place and secured using the patellar clamp. Again, the excess cement was removed using [employer] fast streamer. Once the cement had hardened, the knee was placed through a range of motion with the findings as described above. Therefore, the trial insert was removed and, after verifying that no cement had been retained posteriorly, the permanent 10 mm anterior stabilized E-polyethylene insert was positioned and secured using the appropriate key locking mechanism. Again the knee was placed through a range of motion with the findings as described  above.  The wound was copiously irrigated with sterile saline solution using the jet lavage system before the quadriceps tendon and retinacular layer were reapproximated using #0 Vicryl interrupted sutures. The superficial retinacular layer also was closed using a running #0 Vicryl suture. A total of 10 cc of transexemic acid (TXA) was injected intra-articularly before the subcutaneous tissues were closed in several layers using 2-0 Vicryl interrupted sutures. The skin was closed using staples. A sterile honeycomb dressing was applied to the skin before the leg was wrapped with an Ace wrap to accommodate the Polar Care device. The patient was then awakened and returned to the recovery room in satisfactory condition after tolerating the procedure well.

## 2020-11-02 NOTE — Evaluation (Signed)
Physical Therapy Evaluation Patient Details Name: Laura Zhang MRN: [id number] DOB: 1946-02-05 Today's Date: 11/02/2020   History of Present Illness  Per MD note, pt is a 75 y.o.female who presents with a several year history of progressively worsening right knee pain. Pt is s/p R TKA. PMH includes HTN, A-fib, obesity, anemia, and pulmonary embolus.  Clinical Impression  Pt was pleasant and motivated to participate during the session. Pt's HR and SpO2 WNL throughout session. Pt performed bed mobility with min assist and verbal cues but very limited with OOB activities requiring min-mod assist secondary to pain and light headedness. Pt's BP assessed post OOB activities seated at 102/68 secondary to light headedness in standing. Pt will benefit from HHPT upon discharge to safely address deficits listed in patient problem list for decreased caregiver assistance and eventual return to PLOF.      Follow Up Recommendations Home health PT;Supervision/Assistance - 24 hour    Equipment Recommendations  Rolling walker with 5" wheels;3in1 (PT)    Recommendations for Other Services       Precautions / Restrictions Precautions Precautions: Fall Restrictions Weight Bearing Restrictions: Yes RLE Weight Bearing: Weight bearing as tolerated      Mobility  Bed Mobility Overal bed mobility: Needs Assistance Bed Mobility: Supine to Sit;Sit to Supine     Supine to sit: Min assist Sit to supine: Min assist   General bed mobility comments: Min assist for RLE and verbal cueing for sequencing for supine to sit. Min assist for LEs and sequencing for for sit to supine    Transfers Overall transfer level: Needs assistance Equipment used: Rolling walker (2 wheeled) Transfers: Sit to/from Stand Sit to Stand: Mod assist         General transfer comment: Verbal cues for sequecning and foot placement.  Ambulation/Gait Ambulation/Gait assistance: Min assist Gait Distance (Feet): 2  Feet Assistive device: Rolling walker (2 wheeled) Gait Pattern/deviations: Decreased step length - right;Decreased step length - left;Decreased stance time - right Gait velocity: decreased   General Gait Details: Pt side stepped towards head of bed with mod verbal cues for sequencing. Pt limited by light headedness and had to sit down on bed.  Stairs            Wheelchair Mobility    Modified Rankin (Stroke Patients Only)       Balance Overall balance assessment: Needs assistance Sitting-balance support: Feet supported Sitting balance-Leahy Scale: Fair     Standing balance support: Bilateral upper extremity supported Standing balance-Leahy Scale: Poor Standing balance comment: Heavy reliance on UE with walker                             Pertinent Vitals/Pain Pain Assessment: 0-10 Pain Score: 3  Pain Location: R knee Pain Descriptors / Indicators: Aching;Sore Pain Intervention(s): Limited activity within patient's tolerance;Monitored during session;Ice applied    Home Living Family/patient expects to be discharged to:: Private residence Living Arrangements: Alone Available Help at Discharge: Family;Available 24 hours/day Type of Home: House Home Access: Stairs to enter Entrance Stairs-Rails: None Entrance Stairs-Number of Steps: 2 stairs to porch and 1 step to enter house Home Layout: One level Home Equipment: None      Prior Function Level of Independence: Independent         Comments: Pt independent with all ADLs and ambulates with no AD needing breaks intermittently secondary to pain. No hx of falls Pt works full time at a group  home.     Hand Dominance        Extremity/Trunk Assessment   Upper Extremity Assessment Upper Extremity Assessment: Overall WFL for tasks assessed    Lower Extremity Assessment Lower Extremity Assessment: Generalized weakness;RLE deficits/detail RLE Deficits / Details: Limited by post op pain RLE: Unable  to fully assess due to pain RLE Sensation: WNL    Cervical / Trunk Assessment Cervical / Trunk Assessment: Normal  Communication   Communication: No difficulties  Cognition Arousal/Alertness: Awake/alert Behavior During Therapy: WFL for tasks assessed/performed Overall Cognitive Status: Within Functional Limits for tasks assessed                                        General Comments      Exercises Total Joint Exercises Ankle Circles/Pumps: AROM;Strengthening;Both;15 reps;Supine Quad Sets: AROM;Strengthening;Both;10 reps;Supine Long Arc Quad: AROM;Strengthening;10 reps;Supine;Right Knee Flexion: AROM;Strengthening;10 reps;Seated;Right Goniometric ROM: R knee AROM 16-89 Other Exercises Other Exercises: Pt HEP education via explanation and handout Other Exercises: Static sitting EOB 7-17min for improved trunk control and balance.   Assessment/Plan    PT Assessment Patient needs continued PT services  PT Problem List Decreased strength;Decreased range of motion;Decreased activity tolerance;Decreased balance;Decreased mobility;Decreased knowledge of use of DME;Decreased safety awareness;Pain       PT Treatment Interventions DME instruction;Gait training;Stair training;Functional mobility training;Therapeutic activities;Therapeutic exercise;Balance training;Patient/family education    PT Goals (Current goals can be found in the Care Plan section)  Acute Rehab PT Goals Patient Stated Goal: Get back to walking PT Goal Formulation: With patient Time For Goal Achievement: 11/15/20 Potential to Achieve Goals: Good    Frequency BID   Barriers to discharge        Co-evaluation               AM-PAC PT "6 Clicks" Mobility  Outcome Measure Help needed turning from your back to your side while in a flat bed without using bedrails?: A Little Help needed moving from lying on your back to sitting on the side of a flat bed without using bedrails?: A  Little Help needed moving to and from a bed to a chair (including a wheelchair)?: A Lot Help needed standing up from a chair using your arms (e.g., wheelchair or bedside chair)?: A Lot Help needed to walk in hospital room?: A Lot Help needed climbing 3-5 steps with a railing? : Total 6 Click Score: 13    End of Session Equipment Utilized During Treatment: Gait belt Activity Tolerance: Patient limited by pain;Other (comment) (Limited by light headedness) Patient left: in bed;with call bell/phone within reach;with bed alarm set;with family/visitor present;with SCD's reapplied;Other (comment) (Polar care applied to RLE) Nurse Communication: Mobility status PT Visit Diagnosis: Unsteadiness on feet (R26.81);Other abnormalities of gait and mobility (R26.89);Muscle weakness (generalized) (M62.81);Pain Pain - Right/Left: Right Pain - part of body: Knee    Time: 3729-0211 PT Time Calculation (min) (ACUTE ONLY): 56 min   Charges:              Dayton Scrape SPT 11/02/20, 3:34 PM

## 2020-11-02 NOTE — Anesthesia Preprocedure Evaluation (Signed)
Anesthesia Evaluation  Patient identified by MRN, date of birth, ID band Patient awake    Reviewed: Allergy & Precautions, NPO status , Patient's Chart, lab work & pertinent test results  History of Anesthesia Complications Negative for: history of anesthetic complications  Airway Mallampati: II  TM Distance: >3 FB Neck ROM: Full    Dental  (+) Poor Dentition, Missing   Pulmonary neg pulmonary ROS, neg sleep apnea, neg COPD,    breath sounds clear to auscultation- rhonchi (-) wheezing      Cardiovascular hypertension, Pt. on medications (-) CAD, (-) Past MI, (-) Cardiac Stents and (-) CABG + dysrhythmias Atrial Fibrillation  Rhythm:Regular Rate:Normal - Systolic murmurs and - Diastolic murmurs    Neuro/Psych neg Seizures negative neurological ROS  negative psych ROS   GI/Hepatic Neg liver ROS, GERD  ,  Endo/Other  negative endocrine ROSneg diabetes  Renal/GU negative Renal ROS     Musculoskeletal  (+) Arthritis ,   Abdominal (+) + obese,   Peds  Hematology  (+) anemia ,   Anesthesia Other Findings Past Medical History: No date: Anemia No date: Arthritis No date: Atrial fibrillation (HCC) No date: Constipation No date: GERD (gastroesophageal reflux disease) No date: Hemorrhoids No date: History of pulmonary embolus (PE) No date: Hyperlipidemia No date: Hypertension No date: Vitamin D deficiency   Reproductive/Obstetrics                             Lab Results  Component Value Date   WBC 3.1 (L) 10/25/2020   HGB 11.3 (L) 10/25/2020   HCT 34.3 (L) 10/25/2020   MCV 84.7 10/25/2020   PLT 231 10/25/2020    Anesthesia Physical Anesthesia Plan  ASA: 3  Anesthesia Plan: Spinal   Post-op Pain Management:    Induction:   PONV Risk Score and Plan: 2 and Propofol infusion  Airway Management Planned: Natural Airway  Additional Equipment:   Intra-op Plan:   Post-operative  Plan:   Informed Consent: I have reviewed the patients History and Physical, chart, labs and discussed the procedure including the risks, benefits and alternatives for the proposed anesthesia with the patient or authorized representative who has indicated his/her understanding and acceptance.     Dental advisory given  Plan Discussed with: CRNA and Anesthesiologist  Anesthesia Plan Comments:         Anesthesia Quick Evaluation

## 2020-11-02 NOTE — H&P (Signed)
History of Present Illness: Laura Zhang is a 75 y.o.female who presents with a several year history of progressively worsening right knee pain. She saw Reche Dixon, PA-C, who performed two steroid injections and a series of viscosupplementation injections. However, because of continued/recurrent symptoms, the patient was referred to me for further evaluation and treatment. She reports only 1/10 pain in the knee on today's visit, but notes that her symptoms can worsen with prolonged standing or ambulation. She also has difficulty reciprocating stairs. The pain is located along the medial aspect of the knee. The pain is described as aching, dull, stabbing and throbbing. The symptoms are aggravated using stairs, at higher levels of activity, rising from a chair, walking, standing and standing pivot. She also describes mechanical symptoms. She has associated mild swelling and deformity. She has tried acetaminophen, over-the-counter medications, steroid injections, viscosupplementation injections and a home exercise program with temporary partial relief.  Current Outpatient Medications:  acetaminophen (TYLENOL) 650 MG ER tablet Take 650 mg by mouth as needed for Pain.   apixaban (ELIQUIS) 5 mg tablet Take 5 mg by mouth every 12 (twelve) hours   chlorthalidone 25 MG tablet Take 25 mg by mouth once daily   cholecalciferol (VITAMIN D3) 1,000 unit capsule Take 1,000 Units by mouth once daily   hydrALAZINE (APRESOLINE) 50 MG tablet Take 50 mg by mouth 2 (two) times daily   labetaloL (TRANDATE) 100 MG tablet Take 100 mg by mouth 2 (two) times daily   losartan (COZAAR) 100 MG tablet Take 100 mg by mouth once daily   lovastatin (MEVACOR) 40 MG tablet Take 40 mg by mouth once daily   pantoprazole (PROTONIX) 40 MG DR tablet Take 1 tablet (40 mg total) by mouth once daily 90 tablet 3   Allergies: No Known Allergies  Past Medical History:   Arthritis   Constipation   GERD (gastroesophageal reflux disease)    Hemorrhoids   Hyperlipidemia   Hypertension   Impaired glucose tolerance   Postoperative pulmonary embolism (CMS-HCC) 04/29/2016   Vitamin D deficiency   Past Surgical History:   CATARACT EXTRACTION Left 03/18/2019   CATARACT EXTRACTION Right 04/15/2019   COLONOSCOPY 11/28/1989 (Non-Neoplastic Polyp)   COLONOSCOPY 05/22/2001 (Adenomatous Polyps)   COLONOSCOPY 06/20/2002 ([place] Adenomatous Polyps)   COLONOSCOPY 06/21/2004 ([place] Adenomatous Polyps)   COLONOSCOPY 09/27/2009 (Adenomatous Polyps)   COLONOSCOPY 10/14/2012 (Adenomatous Polyps: CBF 10/2017)   COLONOSCOPY 06/05/2018 (Adenomatous Polyp: CBF 05/2021)   EGD 10/14/2012, 02/16/2010, 06/20/2002, 01/21/1996, 11/13/1989   EGD 03/24/2015 (No repeat per RTE)   EGD 07/24/2019 (Negative EGD biopsy/Esophagus dilated./No Repeat/TKT)   ENDOSCOPIC LEFT CARPAL TUNNEL RELEASE late 1990s   FLEXIBLE SIGMOIDOSCOPY 09/29/1999, 03/11/1996   HYSTERECTOMY 1981   JOINT REPLACEMENT Left 04/28/2016   LEFT GREAT TOE FUSION   MANIPULATION JOINT UNDER ANESTHESIA KNEE Left 05/31/2016   Family History:   Diabetes Mother   Arthritis Mother   Alcohol abuse Father   High blood pressure (Hypertension) Father   Myocardial Infarction (Heart attack) Father in his 36s and his 73s   Diabetes Sister   High blood pressure (Hypertension) Sister   Hypothyroidism Sister   Osteoporosis (Thinning of bones) Grandmother   Social History:   Socioeconomic History:   Marital status: Widowed  Tobacco Use   Smoking status: Never Smoker   Smokeless tobacco: Never Used  Vaping Use   Vaping Use: Never used  Substance and Sexual Activity   Alcohol use: No  Alcohol/week: 0.0 standard drinks   Drug use: Never   Sexual  activity: Defer  Social History Narrative  Lives in [place], [place] with husband. Activity limited by OA. Independent in ADLs. No smoking.   Review of Systems:  A comprehensive 14 point ROS was performed, reviewed, and the pertinent orthopaedic findings are  documented in the HPI.  Physical Exam: Vitals:  08/16/20 1346  BP: (!) 158/90  Weight: 83.4 kg (183 lb 12.8 oz)  Height: 162.6 cm (5\' 4" )  PainSc: 1  PainLoc: Knee   General/Constitutional: The patient appears to be well-nourished, well-developed, and in no acute distress. Neuro/Psych: Normal mood and affect, oriented to person, place and time. Eyes: Non-icteric. Pupils are equal, round, and reactive to light, and exhibit synchronous movement. Lymphatic: No palpable adenopathy. Respiratory: Lungs clear to auscultation, Normal chest excursion, No wheezes and Non-labored breathing Cardiovascular: Regular rate and rhythm. No murmurs. and No edema, swelling or tenderness, except as noted in detailed exam. Vascular: No edema, swelling or tenderness, except as noted in detailed exam. Integumentary: No impressive skin lesions present, except as noted in detailed exam. Musculoskeletal: Unremarkable, except as noted in detailed exam.  Right knee exam: GAIT: mild limp and uses no assistive devices. ALIGNMENT: mild varus SKIN: unremarkable SWELLING: minimal EFFUSION: trace WARMTH: no warmth TENDERNESS: mild over the medial joint line ROM: 5 to 95 degrees with mild discomfort at the extremes of flexion more so than extension McMURRAY'S: equivocal PATELLOFEMORAL: normal tracking with no peri-patellar tenderness and negative apprehension sign CREPITUS: no LACHMAN'S: negative PIVOT SHIFT: negative ANTERIOR DRAWER: negative POSTERIOR DRAWER: negative VARUS/VALGUS: mild laxity to varus stressing  She is neurovascularly intact to the right lower extremity and foot.  Knee Imaging: Recent AP weightbearing of both knees, as well as lateral and merchant views of the right knee are available for review and have been reviewed by myself. These films demonstrate severe degenerative changes, primarily involving the medial compartment with 100% medial joint space narrowing. Overall alignment is mild  varus. No fractures, lytic lesions, or abnormal calcifications are noted.  Assessment:   Primary osteoarthritis of right knee.   Plan: The treatment options were discussed with the patient. In addition, patient educational materials were provided regarding the diagnosis and treatment options. The patient is quite frustrated by her persistent symptoms and functional limitations, and is ready to consider more aggressive treatment options. Therefore, I have recommended a surgical procedure, specifically a right total knee arthroplasty. The procedure was discussed with the patient, as were the potential risks (including bleeding, infection, nerve and/or blood vessel injury, persistent or recurrent pain, loosening and/or failure of the components, dislocation, need for further surgery, blood clots, strokes, heart attacks and/or arhythmias, pneumonia, etc.) and benefits. The patient states his/her understanding and wishes to proceed. All of the patient's questions and concerns were answered. She can call any time with further concerns. She will follow up post-surgery, routine.   H&P reviewed and patient re-examined. No changes.

## 2020-11-02 NOTE — Transfer of Care (Signed)
Immediate Anesthesia Transfer of Care Note  Patient: Laura Zhang  Procedure(s) Performed: TOTAL KNEE ARTHROPLASTY (Right: Knee)  Patient Location: PACU  Anesthesia Type:Spinal  Level of Consciousness: awake, alert  and oriented  Airway & Oxygen Therapy: Patient Spontanous Breathing and Patient connected to face mask oxygen  Post-op Assessment: Report given to RN and Post -op Vital signs reviewed and stable  Post vital signs: Reviewed and stable  Last Vitals:  Vitals Value Taken Time  BP    Temp    Pulse 63 11/02/20 1011  Resp 19 11/02/20 1011  SpO2 100 % 11/02/20 1011  Vitals shown include unvalidated device data.  Last Pain:  Vitals:   11/02/20 0614  TempSrc: Oral  PainSc: 0-No pain         Complications: No notable events documented.

## 2020-11-03 ENCOUNTER — Encounter: Payer: Self-pay | Admitting: Surgery

## 2020-11-03 LAB — BASIC METABOLIC PANEL
Anion gap: 6 (ref 5–15)
BUN: 14 mg/dL (ref 8–23)
CO2: 25 mmol/L (ref 22–32)
Calcium: 8.4 mg/dL — ABNORMAL LOW (ref 8.9–10.3)
Chloride: 101 mmol/L (ref 98–111)
Creatinine, Ser: 0.98 mg/dL (ref 0.44–1.00)
GFR, Estimated: >60 mL/min (ref >60)
Glucose, Bld: 120 mg/dL — ABNORMAL HIGH (ref 70–99)
Potassium: 3.7 mmol/L (ref 3.5–5.1)
Sodium: 132 mmol/L — ABNORMAL LOW (ref 135–145)

## 2020-11-03 LAB — CBC
HCT: 26.8 % — ABNORMAL LOW (ref 36.0–46.0)
Hemoglobin: 9 g/dL — ABNORMAL LOW (ref 12.0–15.0)
MCH: 28.3 pg (ref 26.0–34.0)
MCHC: 33.6 g/dL (ref 30.0–36.0)
MCV: 84.3 fL (ref 80.0–100.0)
Platelets: 153 10*3/uL (ref 150–400)
RBC: 3.18 MIL/uL — ABNORMAL LOW (ref 3.87–5.11)
RDW: 13.3 % (ref 11.5–15.5)
WBC: 4.7 10*3/uL (ref 4.0–10.5)
nRBC: 0 % (ref 0.0–0.2)

## 2020-11-03 LAB — GLUCOSE, CAPILLARY: Glucose-Capillary: 119 mg/dL — ABNORMAL HIGH (ref 70–99)

## 2020-11-03 NOTE — Progress Notes (Signed)
OT Cancellation Note  Patient Details Name: Laura Zhang MRN: [id number] DOB: 09-Sep-1945   Cancelled Treatment:    Reason Eval/Treat Not Completed: Fatigue/lethargy limiting ability to participate;Other (comment). Order received and chart reviewed. Upon arrival to pt room, pt in bed, endorses feeling light headed and dizzy. Pt notably lethargic, requires increased time to respond to questions. RN notified/aware. Will hold OT evaluation at this time until pt more able to participate in therapy services. Will re-attempt at a later date/time as available and pt medically appropriate.   Shara Blazing, M.S., OTR/L Ascom: [phone] 11/03/20, 10:17 AM

## 2020-11-03 NOTE — Progress Notes (Signed)
Subjective: 1 Day Post-Op Procedure(s) (LRB): TOTAL KNEE ARTHROPLASTY (Right) Patient reports pain as mild.   Patient is well, and has had no acute complaints or problems Plan is to go Home after hospital stay. Negative for chest pain and shortness of breath Fever: no Gastrointestinal:Negative for nausea and vomiting Patient is passing gas this AM, has not had a BM yet.  Objective: Vital signs in last 24 hours: Temp:  [97.1 F (36.2 C)-99.1 F (37.3 C)] 99.1 F (37.3 C) (06/29 0357) Pulse Rate:  [49-73] 73 (06/29 0357) Resp:  [9-19] 16 (06/29 0357) BP: (107-143)/(44-70) 108/44 (06/29 0357) SpO2:  [93 %-100 %] 97 % (06/29 0357)  Intake/Output from previous day:  Intake/Output Summary (Last 24 hours) at 11/03/2020 0748 Last data filed at 11/03/2020 0300 Gross per 24 hour  Intake 381.25 ml  Output 1025 ml  Net -643.75 ml    Intake/Output this shift: No intake/output data recorded.  Labs: Recent Labs    11/03/20 0508  HGB 9.0*   Recent Labs    11/03/20 0508  WBC 4.7  RBC 3.18*  HCT 26.8*  PLT 153   Recent Labs    11/03/20 0508  NA 132*  K 3.7  CL 101  CO2 25  BUN 14  CREATININE 0.98  GLUCOSE 120*  CALCIUM 8.4*   No results for input(s): LABPT, INR in the last 72 hours.   EXAM General - Patient is Alert, Appropriate, and Oriented Extremity - ABD soft Neurovascular intact Sensation intact distally Intact pulses distally Dorsiflexion/Plantar flexion intact Incision: dressing C/D/I No cellulitis present Dressing/Incision - clean, dry, no drainage, ACE wrap intact to the right leg. Motor Function - intact, moving foot and toes well on exam.  Abdomen soft with normal bowel sounds this AM.  Past Medical History:  Diagnosis Date   Anemia    Arthritis    Atrial fibrillation (HCC)    Constipation    GERD (gastroesophageal reflux disease)    Hemorrhoids    History of pulmonary embolus (PE)    Hyperlipidemia    Hypertension    Vitamin D deficiency      Assessment/Plan: 1 Day Post-Op Procedure(s) (LRB): TOTAL KNEE ARTHROPLASTY (Right) Active Problems:   Status post total knee replacement using cement, right  Estimated body mass index is 30.79 kg/m as calculated from the following:   Height as of this encounter: 5' 4.5" (1.638 m).   Weight as of this encounter: 82.6 kg. Advance diet Up with therapy D/C IV fluids when tolerating po intake.  Labs reviewed this AM, Hg 9.0.   Mild fever last night, encouraged incentive spirometer. Up with therapy today, plan for discharge home with HHPT. Work on [employer]. Plan for possible d/c home tomorrow, today if therapy goes really well.  DVT Prophylaxis - Foot Pumps, TED hose, and Eliquis Weight-Bearing as tolerated to right leg  J. Cameron Proud, PA-C [facility] 11/03/2020, 7:48 AM

## 2020-11-03 NOTE — TOC Progression Note (Signed)
Transition of Care [facility]) - Progression Note    Patient Details  Name: Laura Zhang MRN: [id number] Date of Birth: August 17, 1945  [facility]) CM/SW Anna, RN Phone Number: 11/03/2020, 2:30 PM  Clinical Narrative:   RNCM in to see patient at bedside, discussed recommendation of SNF.  Patient consented to bed search in the local [place] area.  Patient stated she does not want [facility].  Bed search started.  TOC contact information given, TOC to follow through discharge.    Expected Discharge Plan: [place] Barriers to Discharge: Barriers Resolved  Expected Discharge Plan and Services Expected Discharge Plan: [place]   Discharge Planning Services: CM Consult   Living arrangements for the past 2 months: Single Family Home                 DME Arranged: Walker rolling, 3-N-1 DME Agency: [employer] Date DME Agency Contacted: 11/03/20 Time DME Agency Contacted: [phone] Representative spoke with at DME Agency: [place]: PT [place]: [facility]) Date [place]: 11/03/20 Time [place]: 0920 Representative spoke with at [place]: [place]   Social Determinants of Health ([place]) Interventions    Readmission Risk Interventions No flowsheet data found.

## 2020-11-03 NOTE — Progress Notes (Signed)
Physical Therapy Treatment Patient Details Name: Laura Zhang MRN: [id number] DOB: 1946-02-08 Today's Date: 11/03/2020    History of Present Illness Per MD note, pt is a 75 y.o.female who presents with a several year history of progressively worsening right knee pain. Pt is s/p R TKA. PMH includes HTN, A-fib, obesity, anemia, and pulmonary embolus.    PT Comments    Pt was pleasant and motivated to participate during the session. Pt performed bed mobility without physical assistance. Pt required less physical assistance with transfers but unable to ambulate secondary to reported light headedness and generalized weakness. Pt's BP assessed in sitting at 120/58 and standing but unable to stand for full duration of BP reading at 105/56 secondary to increased light headedness. Pt reported still light headed in sitting but less than in standing. Pt will benefit from PT services in a SNF setting upon discharge to safely address deficits listed in patient problem list for decreased caregiver assistance and eventual return to PLOF.     Follow Up Recommendations  SNF;Supervision/Assistance - 24 hour     Equipment Recommendations  Rolling walker with 5" wheels;3in1 (PT)    Recommendations for Other Services       Precautions / Restrictions Precautions Precautions: Fall Restrictions Weight Bearing Restrictions: Yes RLE Weight Bearing: Weight bearing as tolerated    Mobility  Bed Mobility Overal bed mobility: Needs Assistance Bed Mobility: Supine to Sit;Sit to Supine     Supine to sit: Supervision Sit to supine: Supervision        Transfers Overall transfer level: Needs assistance Equipment used: Rolling walker (2 wheeled) Transfers: Sit to/from Stand Sit to Stand: Min assist         General transfer comment: Verbal cues for foot placement.  Ambulation/Gait             General Gait Details: NT, pt limited by light headedness requiring her to return to  sitting.   Stairs             Wheelchair Mobility    Modified Rankin (Stroke Patients Only)       Balance Overall balance assessment: Needs assistance Sitting-balance support: Feet supported Sitting balance-Leahy Scale: Good     Standing balance support: Bilateral upper extremity supported Standing balance-Leahy Scale: Poor Standing balance comment: Limited by light headedness in standing                            Cognition Arousal/Alertness: Awake/alert Behavior During Therapy: WFL for tasks assessed/performed Overall Cognitive Status: Within Functional Limits for tasks assessed                                        Exercises Total Joint Exercises Ankle Circles/Pumps: AROM;Strengthening;Both;15 reps;Supine Quad Sets: AROM;Strengthening;Both;10 reps;Supine Towel Squeeze: AROM;Strengthening;Both;10 reps;Seated;Other (comment) (with pillow) Straight Leg Raises: AAROM;Strengthening;Right;10 reps;Supine Long Arc Quad: AROM;Strengthening;10 reps;Supine;Right Knee Flexion: AROM;Strengthening;10 reps;Seated;Right Goniometric ROM: R knee AROM 15-79 Marching in Standing: AROM;Strengthening;Both;10 reps;Standing Other Exercises Other Exercises: Static standing x2 for 1-93min for increased activity tolerance. Other Exercises: Static sitting EOB 3-13min for improved trunk control and balance.    General Comments        Pertinent Vitals/Pain Pain Assessment: 0-10 Pain Score: 3  Pain Location: R knee Pain Descriptors / Indicators: Aching;Sore Pain Intervention(s): Limited activity within patient's tolerance;Monitored during session;Ice applied    Home Living  Prior Function            PT Goals (current goals can now be found in the care plan section) Progress towards PT goals: Progressing toward goals    Frequency    BID      PT Plan Discharge plan needs to be updated    Co-evaluation               AM-PAC PT "6 Clicks" Mobility   Outcome Measure  Help needed turning from your back to your side while in a flat bed without using bedrails?: None Help needed moving from lying on your back to sitting on the side of a flat bed without using bedrails?: None Help needed moving to and from a bed to a chair (including a wheelchair)?: A Lot Help needed standing up from a chair using your arms (e.g., wheelchair or bedside chair)?: A Little Help needed to walk in hospital room?: A Lot Help needed climbing 3-5 steps with a railing? : Total 6 Click Score: 16    End of Session Equipment Utilized During Treatment: Gait belt Activity Tolerance: Patient limited by pain;Other (comment) (Limited by light headedness) Patient left: in bed;with call bell/phone within reach;with bed alarm set;with family/visitor present;with SCD's reapplied;Other (comment) (Polar care applied to RLE) Nurse Communication: Mobility status,Notified of light headedness and BP PT Visit Diagnosis: Unsteadiness on feet (R26.81);Other abnormalities of gait and mobility (R26.89);Muscle weakness (generalized) (M62.81);Pain Pain - Right/Left: Right Pain - part of body: Knee     Time: 0915-0957 PT Time Calculation (min) (ACUTE ONLY): 42 min  Charges:                        Dayton Scrape SPT 11/03/20, 11:25 AM

## 2020-11-03 NOTE — Anesthesia Postprocedure Evaluation (Signed)
Anesthesia Post Note  Patient: Laura Zhang  Procedure(s) Performed: TOTAL KNEE ARTHROPLASTY (Right: Knee)  Patient location during evaluation: Nursing Unit Anesthesia Type: Spinal Level of consciousness: awake, oriented and awake and alert Pain management: pain level controlled Vital Signs Assessment: post-procedure vital signs reviewed and stable Respiratory status: spontaneous breathing, nonlabored ventilation and respiratory function stable Cardiovascular status: blood pressure returned to baseline and stable Postop Assessment: no headache and no backache Anesthetic complications: no   No notable events documented.   Last Vitals:  Vitals:   11/02/20 2054 11/03/20 0357  BP: (!) 107/51 (!) 108/44  Pulse: 67 73  Resp: 16 16  Temp: 37.1 C 37.3 C  SpO2: 100% 97%    Last Pain:  Vitals:   11/03/20 0357  TempSrc: Oral  PainSc:                  [name]

## 2020-11-03 NOTE — Progress Notes (Signed)
Physical Therapy Treatment Patient Details Name: Laura Zhang MRN: [id number] DOB: 1945/11/20 Today's Date: 11/03/2020    History of Present Illness Per MD note, pt is a 75 y.o.female who presents with a several year history of progressively worsening right knee pain. Pt is s/p R TKA. PMH includes HTN, A-fib, obesity, anemia, and pulmonary embolus.    PT Comments    Pt was pleasant and motivated to participate during the session. Pt gave good effort during session. Pt's HR and SpO2 WNL throughout session. Pt required no to min assistance for functional mobility. Pt ambulated using RW with verbal cues for sequencing and walker placement. Pt still limited by pain and generalized weakness. Pt is making good progress towards goals. Pt will benefit from PT services in a SNF setting upon discharge to safely address deficits listed in patient problem list for decreased caregiver assistance and eventual return to PLOF.   Follow Up Recommendations  SNF;Supervision/Assistance - 24 hour     Equipment Recommendations  Rolling walker with 5" wheels;3in1 (PT)    Recommendations for Other Services       Precautions / Restrictions Precautions Precautions: Fall;Knee Restrictions Weight Bearing Restrictions: Yes RLE Weight Bearing: Weight bearing as tolerated    Mobility  Bed Mobility Overal bed mobility: Needs Assistance Bed Mobility: Supine to Sit     Supine to sit: Supervision          Transfers Overall transfer level: Needs assistance Equipment used: Rolling walker (2 wheeled) Transfers: Sit to/from Stand Sit to Stand: Min guard         General transfer comment: Verbal cues for foot placement.  Ambulation/Gait Ambulation/Gait assistance: Min assist Gait Distance (Feet): 20 Feet x1, 5 feet x1 Assistive device: Rolling walker (2 wheeled) Gait Pattern/deviations: Decreased step length - left;Decreased stance time - right;Decreased stride length;Decreased weight shift to  right;Wide base of support Gait velocity: decreased   General Gait Details: Verbal cues for sequencing and walker placement. Pt reported mild light headedness from pain meds.   Stairs             Wheelchair Mobility    Modified Rankin (Stroke Patients Only)       Balance Overall balance assessment: Needs assistance Sitting-balance support: Feet supported Sitting balance-Leahy Scale: Good     Standing balance support: Bilateral upper extremity supported Standing balance-Leahy Scale: Poor Standing balance comment: Reliance on UEs through RW                            Cognition Arousal/Alertness: Awake/alert Behavior During Therapy: WFL for tasks assessed/performed Overall Cognitive Status: Within Functional Limits for tasks assessed                                        Exercises Total Joint Exercises Ankle Circles/Pumps: AROM;Strengthening;Both;15 reps;Supine Quad Sets: AROM;Strengthening;Both;10 reps;Supine Hip ABduction/ADduction: AROM;Strengthening;Right;10 reps;Supine Straight Leg Raises: AAROM;Strengthening;Right;10 reps;Supine Long Arc Quad: AROM;Strengthening;10 reps;Right;Seated Marching in Standing: AROM;Strengthening;Both;10 reps;Standing Other Exercises Other Exercises: Static standing for 1-24min for increased activity tolerance. Other Exercises: Gait training forward and backward    General Comments        Pertinent Vitals/Pain Pain Assessment: 0-10 Pain Score: 4  Pain Location: R knee with activity Pain Descriptors / Indicators: Aching;Sore;Guarding Pain Intervention(s): Limited activity within patient's tolerance;Monitored during session;Utilized relaxation techniques;Ice applied;Premedicated before session    Home Living Family/patient  expects to be discharged to:: Private residence Living Arrangements: Alone Available Help at Discharge: Family;Available 24 hours/day Type of Home: House Home Access: Stairs to  enter Entrance Stairs-Rails: None Home Layout: One level Home Equipment: Adaptive equipment      Prior Function        Comments: Pt reports she was active and independent prior to surgery. She is eager to get back to activities including bowling. 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 No falls history reported.   PT Goals (current goals can now be found in the care plan section) Progress towards PT goals: Progressing toward goals    Frequency    BID      PT Plan Current plan remains appropriate    Co-evaluation              AM-PAC PT "6 Clicks" Mobility   Outcome Measure  Help needed turning from your back to your side while in a flat bed without using bedrails?: None Help needed moving from lying on your back to sitting on the side of a flat bed without using bedrails?: None Help needed moving to and from a bed to a chair (including a wheelchair)?: A Little Help needed standing up from a chair using your arms (e.g., wheelchair or bedside chair)?: A Little Help needed to walk in hospital room?: A Little Help needed climbing 3-5 steps with a railing? : Total 6 Click Score: 18    End of Session Equipment Utilized During Treatment: Gait belt Activity Tolerance: Patient tolerated treatment well Patient left: in chair;with call bell/phone within reach;with chair alarm set;with family/visitor present;with SCD's reapplied;Other (comment) (Polar care applied to RLE) Nurse Communication: Mobility status PT Visit Diagnosis: Unsteadiness on feet (R26.81);Other abnormalities of gait and mobility (R26.89);Muscle weakness (generalized) (M62.81);Pain Pain - Right/Left: Right Pain - part of body: Knee     Time: 1441-1520 PT Time Calculation (min) (ACUTE ONLY): 39 min  Charges:                        Dayton Scrape SPT 11/03/20, 3:56 PM

## 2020-11-03 NOTE — NC FL2 (Signed)
[place] LEVEL OF CARE SCREENING TOOL     IDENTIFICATION  Patient Name: Laura Zhang Birthdate: 25-Jul-1945 Sex: female Admission Date (Current Location): 11/02/2020  [facility] and [place] Number:  Engineering geologist and Address:  [facility], [address], [place], [place] [address]      Provider Number: [id number]  Attending Physician Name and Address:  Corky Mull, MD  Relative Name and Phone Number:  Jerrilyn Cairo (Sister)   [phone] [facility])    Current Level of Care: Hospital Recommended Level of Care: [place] Prior Approval Number:    Date Approved/Denied: 11/03/20 PASRR Number: [id number]  Discharge Plan: SNF    Current Diagnoses: Patient Active Problem List   Diagnosis Date Noted   Status post total knee replacement using cement, right 11/02/2020   Hypertension 06/29/2017   Arthritis 06/29/2017   History of pulmonary embolus (PE) 06/29/2017    Orientation RESPIRATION BLADDER Height & Weight     Self, Time, Situation, Place  Normal Continent Weight: 82.6 kg Height:  5' 4.5" (163.8 cm)  BEHAVIORAL SYMPTOMS/MOOD NEUROLOGICAL BOWEL NUTRITION STATUS      Continent Diet  AMBULATORY STATUS COMMUNICATION OF NEEDS Skin   Limited Assist Verbally Surgical wounds                       Personal Care Assistance Level of Assistance  Bathing, Feeding, Dressing Bathing Assistance: Limited assistance Feeding assistance: Independent Dressing Assistance: Limited assistance     Functional Limitations Info  Sight, Hearing, Speech Sight Info: Adequate Hearing Info: Adequate Speech Info: Adequate    SPECIAL CARE FACTORS FREQUENCY  PT (By licensed PT), OT (By licensed OT)     PT Frequency: 5 times per week OT Frequency: 5 times per week            Contractures Contractures Info: Not present    Additional Factors Info  Code Status, Allergies Code Status Info: FUll Allergies Info:  NKDA           Current Medications (11/03/2020):  This is the current hospital active medication list Current Facility-Administered Medications  Medication Dose Route Frequency Provider Last Rate Last Admin   0.9 %  sodium chloride infusion   Intravenous Continuous Poggi, Marshall Cork, MD   Stopped at 11/03/20 1230   acetaminophen (TYLENOL) tablet 325-650 mg  325-650 mg Oral Q6H PRN Poggi, Marshall Cork, MD       apixaban Arne Cleveland) tablet 5 mg  5 mg Oral BID Poggi, Marshall Cork, MD   5 mg at 11/03/20 0919   bisacodyl (DULCOLAX) suppository 10 mg  10 mg Rectal Daily PRN Poggi, Marshall Cork, MD       chlorthalidone (HYGROTON) tablet 25 mg  25 mg Oral q AM Poggi, Marshall Cork, MD   25 mg at 11/03/20 0920   cholecalciferol (VITAMIN D3) tablet 2,000 Units  2,000 Units Oral q AM Poggi, Marshall Cork, MD   2,000 Units at 11/03/20 0920   diphenhydrAMINE (BENADRYL) 12.5 MG/5ML elixir 12.5-25 mg  12.5-25 mg Oral Q4H PRN Poggi, Marshall Cork, MD       docusate sodium (COLACE) capsule 100 mg  100 mg Oral BID Poggi, Marshall Cork, MD   100 mg at 11/03/20 0920   hydrALAZINE (APRESOLINE) tablet 100 mg  100 mg Oral BID Corky Mull, MD   100 mg at 11/03/20 0920   HYDROmorphone (DILAUDID) injection 0.25-0.5 mg  0.25-0.5 mg Intravenous Q3H PRN Poggi, Marshall Cork,  MD       labetalol (NORMODYNE) tablet 200 mg  200 mg Oral BID Poggi, Marshall Cork, MD   200 mg at 11/03/20 0920   losartan (COZAAR) tablet 100 mg  100 mg Oral q AM Poggi, Marshall Cork, MD   100 mg at 11/03/20 0919   magnesium hydroxide (MILK OF MAGNESIA) suspension 30 mL  30 mL Oral Daily PRN Poggi, Marshall Cork, MD       metoCLOPramide (REGLAN) tablet 5-10 mg  5-10 mg Oral Q8H PRN Poggi, Marshall Cork, MD       Or   metoCLOPramide (REGLAN) injection 5-10 mg  5-10 mg Intravenous Q8H PRN Poggi, Marshall Cork, MD       ondansetron (ZOFRAN) tablet 4 mg  4 mg Oral Q6H PRN Poggi, Marshall Cork, MD       Or   ondansetron (ZOFRAN) injection 4 mg  4 mg Intravenous Q6H PRN Poggi, Marshall Cork, MD       oxyCODONE (Oxy IR/ROXICODONE) immediate release tablet  5-10 mg  5-10 mg Oral Q4H PRN Poggi, Marshall Cork, MD   5 mg at 11/03/20 1413   pantoprazole (PROTONIX) EC tablet 40 mg  40 mg Oral q AM Poggi, Marshall Cork, MD   40 mg at 11/03/20 0920   polyvinyl alcohol (LIQUIFILM TEARS) 1.4 % ophthalmic solution 1 drop  1 drop Both Eyes PRN Poggi, Marshall Cork, MD       pravastatin (PRAVACHOL) tablet 40 mg  40 mg Oral q1800 Poggi, Marshall Cork, MD   40 mg at 11/02/20 2045   sodium phosphate (FLEET) 7-19 GM/118ML enema 1 enema  1 enema Rectal Once PRN Poggi, Marshall Cork, MD       traMADol Veatrice Bourbon) tablet 50 mg  50 mg Oral Q6H PRN Poggi, Marshall Cork, MD         Discharge Medications: Please see discharge summary for a list of discharge medications.  Relevant Imaging Results:  Relevant Lab Results:   Additional Information SSN [id number]  Su Hilt, RN

## 2020-11-03 NOTE — Evaluation (Signed)
Occupational Therapy Evaluation Patient Details Name: Laura Zhang MRN: [id number] DOB: 04-01-1946 Today's Date: 11/03/2020    History of Present Illness Per MD note, pt is a 75 y.o.female who presents with a several year history of progressively worsening right knee pain. Pt is s/p R TKA. PMH includes HTN, A-fib, obesity, anemia, and pulmonary embolus.   Clinical Impression   Laura Zhang was seen for OT evaluation this date, POD#1 from above surgery. She denies using any AE/DME for ADL management and denies falls history in the past 6 months. Pt is eager to return to PLOF with less pain and improved safety and independence. Pt currently requires minimal assist for LB dressing while in seated position due to pain and limited AROM of R knee. Pt/caregiver (sister at bedside) instructed in polar care mgt, falls prevention strategies, home/routines modifications, DME/AE for LB bathing and dressing tasks, and compression stocking mgt. Pt would benefit from skilled OT services including additional instruction in dressing techniques with or without assistive devices for dressing and bathing skills to support recall and carryover prior to discharge and ultimately to maximize safety, independence, and minimize falls risk and caregiver burden. Recommend STR upon hospital DC.     Follow Up Recommendations  SNF;Supervision - Intermittent    Equipment Recommendations  3 in 1 bedside commode    Recommendations for Other Services       Precautions / Restrictions Precautions Precautions: Fall;Knee Restrictions Weight Bearing Restrictions: Yes RLE Weight Bearing: Weight bearing as tolerated      Mobility Bed Mobility Overal bed mobility: Needs Assistance Bed Mobility: Supine to Sit     Supine to sit: Supervision     General bed mobility comments: Deferred. Pt declines OOB/EOB activity this date 2/2 fatigue and weakness. Educated on benefits of mobility/activity during hospital admission.     Transfers Overall transfer level: Needs assistance Equipment used: Rolling walker (2 wheeled) Transfers: Sit to/from Stand Sit to Stand: Min guard         General transfer comment: Verbal cues for foot placement.    Balance Overall balance assessment: Needs assistance Sitting-balance support: Feet supported Sitting balance-Leahy Scale: Good     Standing balance support: Bilateral upper extremity supported Standing balance-Leahy Scale: Poor Standing balance comment: Reliance on UEs through RW                           ADL either performed or assessed with clinical judgement   ADL Overall ADL's : Needs assistance/impaired                                       General ADL Comments: Pt limited by increased pain and weakness in her RLE. Anticipate she requires MOD-MAX A for LB dressing and bathing tasks in seated position. MAX A to don compression stockings and polar care. Pt with limited participation in ADL management. Would benefit from opportunity to trial AE for LB dressing.     Vision Baseline Vision/History: Wears glasses Wears Glasses: Reading only Patient Visual Report: No change from baseline       Perception     Praxis      Pertinent Vitals/Pain Pain Assessment: 0-10 Pain Score: 5  Pain Location: R knee with activity Pain Descriptors / Indicators: Aching;Sore;Guarding Pain Intervention(s): Limited activity within patient's tolerance;Monitored during session;Utilized relaxation techniques;Ice applied;Premedicated before session     Hand  Dominance Right   Extremity/Trunk Assessment Upper Extremity Assessment Upper Extremity Assessment: Overall WFL for tasks assessed   Lower Extremity Assessment Lower Extremity Assessment: RLE deficits/detail;Defer to PT evaluation RLE Deficits / Details: RLE s/p R TKA       Communication Communication Communication: No difficulties   Cognition Arousal/Alertness: Awake/alert Behavior  During Therapy: WFL for tasks assessed/performed;Flat affect Overall Cognitive Status: Within Functional Limits for tasks assessed                                 General Comments: Pt A&Ox4, able to participate in session. Generally flat affect t/o   General Comments       Exercises Total Joint Exercises Ankle Circles/Pumps: AROM;Strengthening;Both;15 reps;Supine Quad Sets: AROM;Strengthening;Both;10 reps;Supine Hip ABduction/ADduction: AROM;Strengthening;Right;10 reps;Supine Straight Leg Raises: AAROM;Strengthening;Right;10 reps;Supine Long Arc Quad: AROM;Strengthening;10 reps;Right;Seated Marching in Standing: AROM;Strengthening;Both;10 reps;Standing Other Exercises Other Exercises: Pt educated in falls prevention strategies, safe use of AE/DME for LB ADL management, compression stocking management, polar care management, complementary alternative methods for pain management including gentle self-massage and distraction techniques, and routines modifications to support safety and fxl independence during meaningful occupations of daily life.Pt educated in falls prevention strategies, safe use of AE/DME for LB ADL management, compression stocking management, polar care management, complementary alternative methods for pain management including gentle self-massage and distraction techniques, and routines modifications to support safety and fxl independence during meaningful occupations of daily life. Other Exercises: Gait training forward and backward   Shoulder Instructions      Home Living Family/patient expects to be discharged to:: Private residence Living Arrangements: Alone Available Help at Discharge: Family;Available 24 hours/day Type of Home: House Home Access: Stairs to enter [employer] of Steps: 2 stairs to porch and 1 step to enter house Entrance Stairs-Rails: None Home Layout: One level     Bathroom Shower/Tub: Engineer, site: Handicapped height Bathroom Accessibility: Yes How Accessible: Accessible via walker Home Equipment: Radiation protection practitioner Equipment: Reacher        Prior Functioning/Environment          Comments: Pt reports she was active and independent prior to surgery. She is eager to get back to activities including bowling. 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 No falls history reported.        OT Problem List: Decreased strength;Decreased coordination;Decreased activity tolerance;Decreased safety awareness;Impaired balance (sitting and/or standing);Decreased knowledge of use of DME or AE;Pain;Decreased knowledge of precautions      OT Treatment/Interventions:      OT Goals(Current goals can be found in the care plan section) Acute Rehab OT Goals Patient Stated Goal: To get back to bowling and being more active. OT Goal Formulation: With patient Time For Goal Achievement: 11/17/20 Potential to Achieve Goals: Good ADL Goals Pt Will Perform Lower Body Dressing: with set-up;with supervision;sit to/from stand;with adaptive equipment Pt Will Transfer to Toilet: with set-up;with supervision;ambulating;bedside commode Pt Will Perform Toileting - Clothing Manipulation and hygiene: with set-up;with supervision;sit to/from stand;with adaptive equipment  OT Frequency:     Barriers to D/C:            Co-evaluation              AM-PAC OT "6 Clicks" Daily Activity     Outcome Measure Help from another person eating meals?: None Help from another person taking care of personal grooming?: A Little Help from another person toileting, which includes using  toliet, bedpan, or urinal?: A Lot Help from another person bathing (including washing, rinsing, drying)?: A Lot Help from another person to put on and taking off regular upper body clothing?: A Little Help from another person to put on and taking off regular lower body clothing?: A Lot 6 Click Score: 16   End of Session     Activity Tolerance: Patient limited by fatigue;Patient limited by pain Patient left: in bed;with call bell/phone within reach;with bed alarm set;with family/visitor present  OT Visit Diagnosis: Other abnormalities of gait and mobility (R26.89);Pain Pain - Right/Left: Right Pain - part of body: Knee                Time: 1311-1350 OT Time Calculation (min): 39 min Charges:  OT General Charges $OT Visit: 1 Visit OT Evaluation $OT Eval Low Complexity: 1 Low OT Treatments $Self Care/Home Management : 23-37 mins  Shara Blazing, M.S., OTR/L Ascom: [phone] 11/03/20, 3:53 PM

## 2020-11-03 NOTE — Progress Notes (Signed)
Met with the patient to discuss DC plan and needs She will have help at home with her Sister She will have transportation with her sister She can afford her medications Her Doctor's office set up [facility] PT prior to surgery with [place] She will need a 3 in 1 and a Rolling walker, [facility] will bring it to the room prior to DC

## 2020-11-04 LAB — BASIC METABOLIC PANEL
Anion gap: 5 (ref 5–15)
BUN: 14 mg/dL (ref 8–23)
CO2: 25 mmol/L (ref 22–32)
Calcium: 8.7 mg/dL — ABNORMAL LOW (ref 8.9–10.3)
Chloride: 99 mmol/L (ref 98–111)
Creatinine, Ser: 0.79 mg/dL (ref 0.44–1.00)
GFR, Estimated: >60 mL/min (ref >60)
Glucose, Bld: 143 mg/dL — ABNORMAL HIGH (ref 70–99)
Potassium: 4.1 mmol/L (ref 3.5–5.1)
Sodium: 129 mmol/L — ABNORMAL LOW (ref 135–145)

## 2020-11-04 LAB — CBC
HCT: 27.6 % — ABNORMAL LOW (ref 36.0–46.0)
Hemoglobin: 9.4 g/dL — ABNORMAL LOW (ref 12.0–15.0)
MCH: 27.9 pg (ref 26.0–34.0)
MCHC: 34.1 g/dL (ref 30.0–36.0)
MCV: 81.9 fL (ref 80.0–100.0)
Platelets: 166 10*3/uL (ref 150–400)
RBC: 3.37 MIL/uL — ABNORMAL LOW (ref 3.87–5.11)
RDW: 13.1 % (ref 11.5–15.5)
WBC: 7 10*3/uL (ref 4.0–10.5)
nRBC: 0 % (ref 0.0–0.2)

## 2020-11-04 NOTE — Progress Notes (Signed)
Physical Therapy Treatment Patient Details Name: Laura Zhang MRN: [id number] DOB: 01-26-1946 Today's Date: 11/04/2020    History of Present Illness Per MD note, pt is a 75 y.o.female who presents with a several year history of progressively worsening right knee pain. Pt is s/p R TKA. PMH includes HTN, A-fib, obesity, anemia, and pulmonary embolus.    PT Comments    Pt was pleasant and willing to participate during the session with much encouragement. Pt's HR and SpO2 WNL throughout session. Pt required min guard-min assistance for all functional mobility. Pt very limited with ambulation and OOB activities secondary to pain and light headedness. Pt will benefit from PT services in a SNF setting upon discharge to safely address deficits listed in patient problem list for decreased caregiver assistance and eventual return to PLOF.    Follow Up Recommendations  SNF;Supervision/Assistance - 24 hour     Equipment Recommendations  Rolling walker with 5" wheels;3in1 (PT)    Recommendations for Other Services       Precautions / Restrictions Precautions Precautions: Fall;Knee Precaution Booklet Issued: Yes (comment) Restrictions Weight Bearing Restrictions: Yes RLE Weight Bearing: Weight bearing as tolerated    Mobility  Bed Mobility Overal bed mobility: Needs Assistance Bed Mobility: Supine to Sit     Supine to sit: Min assist Sit to supine: Min assist   General bed mobility comments: Min assist for RLE    Transfers Overall transfer level: Needs assistance Equipment used: Rolling walker (2 wheeled) Transfers: Sit to/from Stand Sit to Stand: Min assist         General transfer comment: Pt pushed through UEs through walker to stand without cueing. Verbal cues for sequencing and hand/foot placement.  Ambulation/Gait Ambulation/Gait assistance: Min guard Gait Distance (Feet): 15 Feet Assistive device: Rolling walker (2 wheeled) Gait Pattern/deviations: Decreased  step length - left;Decreased stance time - right;Decreased weight shift to right;Wide base of support;Step-to pattern Gait velocity: decreased   General Gait Details: Pt required much encouragment to ambulate. Verbal cues for sequencing and walker/hand placement.   Stairs             Wheelchair Mobility    Modified Rankin (Stroke Patients Only)       Balance Overall balance assessment: Needs assistance Sitting-balance support: Feet supported Sitting balance-Leahy Scale: Good     Standing balance support: Bilateral upper extremity supported Standing balance-Leahy Scale: Poor Standing balance comment: Heavy reliance on UEs through RW                            Cognition Arousal/Alertness: Awake/alert Behavior During Therapy: [facility] for tasks assessed/performed;Flat affect Overall Cognitive Status: Within Functional Limits for tasks assessed                                        Exercises Total Joint Exercises Ankle Circles/Pumps: AROM;Strengthening;Both;10 reps;Supine Quad Sets: AROM;Strengthening;Both;10 reps;Supine Heel Slides: AAROM;Strengthening;Right;10 reps;Supine Hip ABduction/ADduction: AAROM;Strengthening;Right;10 reps;Supine Straight Leg Raises: AAROM;Strengthening;Right;10 reps;Supine Long Arc Quad: AROM;Strengthening;10 reps;Right;Seated Marching in Standing: AROM;Strengthening;Both;10 reps;Standing Other Exercises Other Exercises: Education on physiological benefits of walking for rehab process. Other Exercises: Static sitting 3-5 min for improved trunk control and balance. Other Exercises: Static standing 1-83min for improved activity tolerance.    General Comments        Pertinent Vitals/Pain Pain Assessment: 0-10 Pain Score: 7  Pain Location: R knee with  activity Pain Descriptors / Indicators: Aching;Sore Pain Intervention(s): Limited activity within patient's tolerance;Monitored during session;Premedicated before  session;Ice applied    Home Living                      Prior Function            PT Goals (current goals can now be found in the care plan section) Progress towards PT goals: Progressing toward goals    Frequency    BID      PT Plan Current plan remains appropriate    Co-evaluation              AM-PAC PT "6 Clicks" Mobility   Outcome Measure  Help needed turning from your back to your side while in a flat bed without using bedrails?: None Help needed moving from lying on your back to sitting on the side of a flat bed without using bedrails?: A Little Help needed moving to and from a bed to a chair (including a wheelchair)?: A Little Help needed standing up from a chair using your arms (e.g., wheelchair or bedside chair)?: A Little Help needed to walk in hospital room?: A Little Help needed climbing 3-5 steps with a railing? : A Lot 6 Click Score: 18    End of Session Equipment Utilized During Treatment: Gait belt Activity Tolerance: Patient limited by pain;Other (comment) (limited by light headedness) Patient left: in chair;with call bell/phone within reach;with chair alarm set;with family/visitor present;with SCD's reapplied;Other (comment) (Polar care applied to RLE) Nurse Communication: Mobility status PT Visit Diagnosis: Unsteadiness on feet (R26.81);Other abnormalities of gait and mobility (R26.89);Muscle weakness (generalized) (M62.81);Pain Pain - Right/Left: Right Pain - part of body: Knee     Time: 1357-1436 PT Time Calculation (min) (ACUTE ONLY): 39 min  Charges:  $Therapeutic Exercise: 23-37 mins $Therapeutic Activity: 8-22 mins                     Dayton Scrape SPT 11/04/20, 3:28 PM

## 2020-11-04 NOTE — Progress Notes (Signed)
Subjective: 2 Days Post-Op Procedure(s) (LRB): TOTAL KNEE ARTHROPLASTY (Right) Patient reports pain as moderate in the right knee.  Patient is well but is progressing very slowly with therapy. Plan is to go Skilled nursing facility after hospital stay. Negative for chest pain and shortness of breath Fever: no Gastrointestinal:Negative for nausea and vomiting Patient is passing gas this AM, has not had a BM yet.  Objective: Vital signs in last 24 hours: Temp:  [97.8 F (36.6 C)-99.2 F (37.3 C)] 98.9 F (37.2 C) (06/30 0809) Pulse Rate:  [69-91] 91 (06/30 0809) Resp:  [16-20] 17 (06/30 0809) BP: (121-155)/(58-62) 155/61 (06/30 0809) SpO2:  [97 %-100 %] 99 % (06/30 0809)  Intake/Output from previous day:  Intake/Output Summary (Last 24 hours) at 11/04/2020 1031 Last data filed at 11/04/2020 1029 Gross per 24 hour  Intake 240 ml  Output 300 ml  Net -60 ml    Intake/Output this shift: Total I/O In: 240 [P.O.:240] Out: -   Labs: Recent Labs    11/03/20 0508 11/04/20 0518  HGB 9.0* 9.4*   Recent Labs    11/03/20 0508 11/04/20 0518  WBC 4.7 7.0  RBC 3.18* 3.37*  HCT 26.8* 27.6*  PLT 153 166   Recent Labs    11/03/20 0508 11/04/20 0518  NA 132* 129*  K 3.7 4.1  CL 101 99  CO2 25 25  BUN 14 14  CREATININE 0.98 0.79  GLUCOSE 120* 143*  CALCIUM 8.4* 8.7*   No results for input(s): LABPT, INR in the last 72 hours.   EXAM General - Patient is Alert, Appropriate, and Oriented Extremity - ABD soft Neurovascular intact Sensation intact distally Intact pulses distally Dorsiflexion/Plantar flexion intact Incision: dressing C/D/I No cellulitis present Dressing/Incision - clean, dry, no drainage, ACE wrap intact to the right leg. Motor Function - intact, moving foot and toes well on exam.  Abdomen soft with normal bowel sounds this AM. Negative homans to the right leg.  Past Medical History:  Diagnosis Date   Anemia    Arthritis    Atrial fibrillation  (HCC)    Constipation    GERD (gastroesophageal reflux disease)    Hemorrhoids    History of pulmonary embolus (PE)    Hyperlipidemia    Hypertension    Vitamin D deficiency     Assessment/Plan: 2 Days Post-Op Procedure(s) (LRB): TOTAL KNEE ARTHROPLASTY (Right) Active Problems:   Status post total knee replacement using cement, right  Estimated body mass index is 30.79 kg/m as calculated from the following:   Height as of this encounter: 5' 4.5" (1.638 m).   Weight as of this encounter: 82.6 kg. Advance diet Up with therapy D/C IV fluids when tolerating po intake.  Labs reviewed this AM, Hg 9.4.   No recent fevers.  Continue to work on BM. Patient progressing slowly with PT.  Unsafe for d/c home at this time. Encouraged patent to continue to work hard with PT.  DVT Prophylaxis - Foot Pumps, TED hose, and Eliquis Weight-Bearing as tolerated to right leg  J. Cameron Proud, PA-C [facility] 11/04/2020, 10:31 AM

## 2020-11-04 NOTE — TOC Progression Note (Signed)
Transition of Care [facility]) - Progression Note    Patient Details  Name: Laura Zhang MRN: [id number] Date of Birth: [birth year]  Transition of Care [facility]) CM/SW [place], RN Phone Number: 11/04/2020, 11:16 AM  Clinical Narrative:     Spoke with the patient and reviewed the bed offers and Medicare Star rating, she chose the bed from [facility], I notified Tammy with [facility], I started the ins auth process with [facility] ref number [id number], uploaded the clinical documents, awaiting approval  Expected Discharge Plan: [place] Barriers to Discharge: Barriers Resolved  Expected Discharge Plan and Services Expected Discharge Plan: [place]   Discharge Planning Services: CM Consult   Living arrangements for the past 2 months: Single Family Home                 DME Arranged: Gilford Rile rolling, 3-N-1 DME Agency: [facility] Date DME Agency Contacted: 11/03/20 Time DME Agency Contacted: [phone] Representative spoke with at DME Agency: [place]: PT [place]: [facility] (now [facility]) Date [place]: 11/03/20 Time [place]. [place]: 0920 Representative spoke with at [place]: [place]   Social Determinants of Health ([place]) Interventions    Readmission Risk Interventions No flowsheet data found.

## 2020-11-04 NOTE — TOC Progression Note (Signed)
Transition of Care [facility]) - Progression Note    Patient Details  Name: Laura Zhang MRN: [id number] Date of Birth: 10-20-1945  Transition of Care [facility]) CM/SW [place], RN Phone Number: 11/04/2020, 2:48 PM  Clinical Narrative:    Aida Puffer approval to go to [facility], Notified [facility] of the auth number [id number]    Expected Discharge Plan: [place] Barriers to Discharge: Barriers Resolved  Expected Discharge Plan and Services Expected Discharge Plan: [place]   Discharge Planning Services: CM Consult   Living arrangements for the past 2 months: Single Family Home                 DME Arranged: Walker rolling, 3-N-1 DME Agency: [employer] Date DME Agency Contacted: 11/03/20 Time DME Agency Contacted: [phone] Representative spoke with at DME Agency: [place]: PT [place]: [facility]now [facility]) Date [place]: 11/03/20 Time [place]: 0920 Representative spoke with at [place]: [place]   Social Determinants of Health ([place]) Interventions    Readmission Risk Interventions No flowsheet data found.

## 2020-11-04 NOTE — Progress Notes (Signed)
Physical Therapy Treatment Patient Details Name: Laura Zhang MRN: [id number] DOB: 1946-03-30 Today's Date: 11/04/2020    History of Present Illness Per MD note, pt is a 75 y.o.female who presents with a several year history of progressively worsening right knee pain. Pt is s/p R TKA. PMH includes HTN, A-fib, obesity, anemia, and pulmonary embolus.    PT Comments    Pt was pleasant and willing to participate during the session.  Pt's HR and SpO2 WNL throughout session. Pt required min-mod assistance for all functional mobility. Pt's BP assessed secondary to light headedness in standing at 151/75 seated and 162/90 standing. Pt very limited with ambulation secondary to pain and light headedness. Pt will benefit from PT services in a SNF setting upon discharge to safely address deficits listed in patient problem list for decreased caregiver assistance and eventual return to PLOF.   Follow Up Recommendations  SNF;Supervision/Assistance - 24 hour     Equipment Recommendations  Rolling walker with 5" wheels;3in1 (PT)    Recommendations for Other Services       Precautions / Restrictions Precautions Precautions: Fall;Knee Precaution Booklet Issued: Yes (comment) Restrictions Weight Bearing Restrictions: Yes RLE Weight Bearing: Weight bearing as tolerated    Mobility  Bed Mobility Overal bed mobility: Needs Assistance Bed Mobility: Supine to Sit     Supine to sit: Min assist Sit to supine: Min assist   General bed mobility comments: Min assist for RLE    Transfers Overall transfer level: Needs assistance Equipment used: Rolling walker (2 wheeled) Transfers: Sit to/from Stand Sit to Stand: Min assist;Mod assist         General transfer comment: Min assist from elevated surface and mod assist from non-elevated surface with a near LOB posteriorly. Verbal cues for foot placement.  Ambulation/Gait Ambulation/Gait assistance: Min assist Gait Distance (Feet): 2  Feet Assistive device: Rolling walker (2 wheeled) Gait Pattern/deviations: Decreased step length - left;Decreased stance time - right;Decreased weight shift to right;Wide base of support Gait velocity: decreased   General Gait Details: Pt performed side steps toward HOB. Verbal cues for sequencing and walker placement. Pt very limited secondary to pain and light headedness.   Stairs             Wheelchair Mobility    Modified Rankin (Stroke Patients Only)       Balance Overall balance assessment: Needs assistance Sitting-balance support: Feet supported Sitting balance-Leahy Scale: Good     Standing balance support: Bilateral upper extremity supported Standing balance-Leahy Scale: Poor Standing balance comment: Heavy reliance on UEs through RW                            Cognition Arousal/Alertness: Awake/alert Behavior During Therapy: WFL for tasks assessed/performed Overall Cognitive Status: Within Functional Limits for tasks assessed                                        Exercises Total Joint Exercises Ankle Circles/Pumps: AROM;Strengthening;Both;15 reps;Supine Quad Sets: AROM;Strengthening;Both;10 reps;Supine Towel Squeeze: AROM;Strengthening;Both;10 reps;Seated;Other (comment) (with pillow) Heel Slides: AAROM;Strengthening;Right;10 reps;Supine Hip ABduction/ADduction: AAROM;Strengthening;Right;10 reps;Supine Straight Leg Raises: AAROM;Strengthening;Right;10 reps;Supine Long Arc Quad: AROM;Strengthening;10 reps;Right;Seated Knee Flexion: AROM;Strengthening;10 reps;Seated;Right Goniometric ROM: R knee AROM 14-75 Other Exercises Other Exercises: Static standing 1-63min x2 for improved activity tolerance. Other Exercises: Static sitting 5-43min for improved trunk control and balance.    General Comments  Pertinent Vitals/Pain Pain Assessment: 0-10 Pain Score: 8  Pain Location: R knee with activity Pain Descriptors /  Indicators: Aching;Sore Pain Intervention(s): Limited activity within patient's tolerance;Monitored during session;Ice applied;RN gave pain meds during session    Home Living                      Prior Function            PT Goals (current goals can now be found in the care plan section) Progress towards PT goals: Progressing toward goals    Frequency    BID      PT Plan Current plan remains appropriate    Co-evaluation              AM-PAC PT "6 Clicks" Mobility   Outcome Measure  Help needed turning from your back to your side while in a flat bed without using bedrails?: None Help needed moving from lying on your back to sitting on the side of a flat bed without using bedrails?: None Help needed moving to and from a bed to a chair (including a wheelchair)?: A Lot Help needed standing up from a chair using your arms (e.g., wheelchair or bedside chair)?: A Lot Help needed to walk in hospital room?: A Lot Help needed climbing 3-5 steps with a railing? : Total 6 Click Score: 15    End of Session Equipment Utilized During Treatment: Gait belt Activity Tolerance: Patient limited by pain;Other (comment) (Limited by light headedness) Patient left: in bed;with call bell/phone within reach;with bed alarm set;with SCD's reapplied;Other (comment) (Polar Care applied to RLE) Nurse Communication: Mobility status (Notified of light headedness) PT Visit Diagnosis: Unsteadiness on feet (R26.81);Other abnormalities of gait and mobility (R26.89);Muscle weakness (generalized) (M62.81);Pain Pain - Right/Left: Right Pain - part of body: Knee     Time: 2197-5883 PT Time Calculation (min) (ACUTE ONLY): 41 min  Charges:                        Dayton Scrape SPT 11/04/20, 1:10 PM

## 2020-11-05 LAB — BASIC METABOLIC PANEL
Anion gap: 4 — ABNORMAL LOW (ref 5–15)
BUN: 15 mg/dL (ref 8–23)
CO2: 26 mmol/L (ref 22–32)
Calcium: 8.7 mg/dL — ABNORMAL LOW (ref 8.9–10.3)
Chloride: 95 mmol/L — ABNORMAL LOW (ref 98–111)
Creatinine, Ser: 0.74 mg/dL (ref 0.44–1.00)
GFR, Estimated: >60 mL/min (ref >60)
Glucose, Bld: 126 mg/dL — ABNORMAL HIGH (ref 70–99)
Potassium: 4 mmol/L (ref 3.5–5.1)
Sodium: 125 mmol/L — ABNORMAL LOW (ref 135–145)

## 2020-11-05 LAB — SARS CORONAVIRUS 2 (TAT 6-24 HRS): SARS Coronavirus 2: NEGATIVE

## 2020-11-05 MED ORDER — SODIUM CHLORIDE 1 G PO TABS
2.0000 g | ORAL_TABLET | Freq: Two times a day (BID) | ORAL | Status: AC
  Administered 2020-11-05 (×2): 2 g via ORAL
  Filled 2020-11-05 (×2): qty 2

## 2020-11-05 MED ORDER — OXYCODONE HCL 5 MG PO TABS: 5.0000 mg | ORAL_TABLET | ORAL | 0 refills | Status: AC | PRN

## 2020-11-05 MED ORDER — ONDANSETRON HCL 4 MG PO TABS: 4.0000 mg | ORAL_TABLET | Freq: Four times a day (QID) | ORAL | 0 refills | Status: AC | PRN

## 2020-11-05 MED ORDER — TRAMADOL HCL 50 MG PO TABS: 50.0000 mg | ORAL_TABLET | Freq: Four times a day (QID) | ORAL | 0 refills | Status: AC | PRN

## 2020-11-05 NOTE — Discharge Summary (Signed)
Physician Discharge Summary  Patient ID: Laura Zhang MRN: [id number] DOB/AGE: Sep 26, 1945 75 y.o.  Admit date: 11/02/2020 Discharge date: 11/05/2020  Admission Diagnoses:  Status post total knee replacement using cement, right [Z96.651]  Discharge Diagnoses: Patient Active Problem List   Diagnosis Date Noted   Status post total knee replacement using cement, right 11/02/2020   Hypertension 06/29/2017   Arthritis 06/29/2017   History of pulmonary embolus (PE) 06/29/2017    Past Medical History:  Diagnosis Date   Anemia    Arthritis    Atrial fibrillation (HCC)    Constipation    GERD (gastroesophageal reflux disease)    Hemorrhoids    History of pulmonary embolus (PE)    Hyperlipidemia    Hypertension    Vitamin D deficiency      Transfusion: None.   Consultants (if any):   Discharged Condition: Improved  Hospital Course: Laura Zhang is an 75 y.o. female who was admitted 11/02/2020 with a diagnosis of degenerative joint disease of the right knee and went to the operating room on 11/02/2020 and underwent the above named procedures.    Surgeries: Procedure(s): TOTAL KNEE ARTHROPLASTY on 11/02/2020 Patient tolerated the surgery well. Taken to PACU where she was stabilized and then transferred to the orthopedic floor.  Continued on Eliquis 5mg  twice daily. Foot pumps applied bilaterally at 80 mm. Heels elevated on bed with rolled towels. No evidence of DVT. Negative [place]. Physical therapy started on day #1 for gait training and transfer. OT started day #1 for ADL and assisted devices.  Patient's IV was removed on POD3.  Implants: Right TKA using all-cemented Bi[employer] Vanguard system with a 65 mm PCR femur, a 67 mm tibial tray with a 10 mm anterior stabilized E-poly insert, and a 31 x 6.2 mm all-poly 3-pegged domed patella.  She was given perioperative antibiotics:  Anti-infectives (From admission, onward)    Start     Dose/Rate Route Frequency Ordered Stop    11/02/20 1330  ceFAZolin (ANCEF) IVPB 2g/100 mL premix        2 g 200 mL/hr over 30 Minutes Intravenous Every 6 hours 11/02/20 1119 11/03/20 0028   11/02/20 0609  ceFAZolin (ANCEF) 2-4 GM/100ML-% IVPB       Note to Pharmacy: Jordan Hawks  : cabinet override      11/02/20 0609 11/03/20 0028   11/02/20 0600  ceFAZolin (ANCEF) IVPB 2g/100 mL premix        2 g 200 mL/hr over 30 Minutes Intravenous On call to O.R. 11/02/20 6803 11/02/20 0732     .  She was given sequential compression devices, early ambulation, and Eliquis for DVT prophylaxis.  She benefited maximally from the hospital stay and there were no complications.    Recent vital signs:  Vitals:   11/04/20 2350 11/05/20 0503  BP: (!) 131/59 (!) 158/68  Pulse: 91 93  Resp: 18 20  Temp: 98.5 F (36.9 C) 98.4 F (36.9 C)  SpO2: 98% 100%    Recent laboratory studies:  Lab Results  Component Value Date   HGB 9.4 (L) 11/04/2020   HGB 9.0 (L) 11/03/2020   HGB 11.3 (L) 10/25/2020   Lab Results  Component Value Date   WBC 7.0 11/04/2020   PLT 166 11/04/2020   No results found for: INR Lab Results  Component Value Date   NA 125 (L) 11/05/2020   K 4.0 11/05/2020   CL 95 (L) 11/05/2020   CO2 26 11/05/2020   BUN 15 11/05/2020  CREATININE 0.74 11/05/2020   GLUCOSE 126 (H) 11/05/2020    Discharge Medications:   Allergies as of 11/05/2020   No Known Allergies      Medication List     TAKE these medications    acetaminophen 650 MG CR tablet Commonly known as: TYLENOL Take 650 mg by mouth in the morning and at bedtime.   apixaban 5 MG Tabs tablet Commonly known as: ELIQUIS Take 5 mg by mouth 2 (two) times daily.   chlorthalidone 25 MG tablet Commonly known as: HYGROTON Take 25 mg by mouth in the morning.   hydrALAZINE 100 MG tablet Commonly known as: APRESOLINE Take 100 mg by mouth 2 (two) times daily.   labetalol 200 MG tablet Commonly known as: NORMODYNE Take 200 mg by mouth 2 (two) times  daily.   losartan 100 MG tablet Commonly known as: COZAAR Take 100 mg by mouth in the morning.   lovastatin 40 MG tablet Commonly known as: MEVACOR Take 40 mg by mouth at bedtime.   ondansetron 4 MG tablet Commonly known as: ZOFRAN Take 1 tablet (4 mg total) by mouth every 6 (six) hours as needed for nausea.   oxyCODONE 5 MG immediate release tablet Commonly known as: Oxy IR/ROXICODONE Take 1-2 tablets (5-10 mg total) by mouth every 4 (four) hours as needed for moderate pain (pain score 4-6).   pantoprazole 40 MG tablet Commonly known as: PROTONIX Take 40 mg by mouth in the morning.   Systane Complete 0.6 % Soln Generic drug: Propylene Glycol Place 1 drop into both eyes daily as needed (dry eyes).   traMADol 50 MG tablet Commonly known as: ULTRAM Take 1 tablet (50 mg total) by mouth every 6 (six) hours as needed for moderate pain.   Vitamin D 50 MCG (2000 UT) tablet Take 2,000 Units by mouth in the morning.               Durable Medical Equipment  (From admission, onward)           Start     Ordered   11/02/20 1120  DME Bedside commode  Once       Question:  Patient needs a bedside commode to treat with the following condition  Answer:  Status post total knee replacement using cement, right   11/02/20 1119   11/02/20 1120  DME 3 n 1  Once        11/02/20 1119   11/02/20 1120  DME Walker rolling  Once       Question Answer Comment  Walker: With 5 Inch Wheels   Patient needs a walker to treat with the following condition Status post total knee replacement using cement, right      11/02/20 1119            Diagnostic Studies: DG Knee Right Port  Result Date: 11/02/2020 CLINICAL DATA:  Follow-up total knee replacement EXAM: PORTABLE RIGHT KNEE - 1-2 VIEW COMPARISON:  None. FINDINGS: Two-view show total knee replacement. Components appear well positioned. Fluid in air in the joint. Some air in the adjacent soft tissues. No unexpected finding. IMPRESSION:  Good appearance following total knee replacement. Electronically Signed   By: Nelson Chimes M.D.   On: 11/02/2020 11:24    Disposition: Plan for discharge to SNF on 11/05/20 pending a bowel movement.    Contact information for follow-up providers     Lattie Corns, PA-C Follow up.   Specialty: Physician Assistant Why: Staple removal. Contact information: [address] [address] [phone]              Contact information for after-discharge care     Destination     [facility] Preferred SNF .   Service: Skilled Nursing Contact information: [address] [place] [phone]                    Signed: Judson Roch PA-C 11/05/2020, 8:03 AM

## 2020-11-05 NOTE — Discharge Instructions (Signed)

## 2020-11-05 NOTE — Progress Notes (Signed)
Physical Therapy Treatment Patient Details Name: Laura Zhang MRN: [id number] DOB: 1946-05-08 Today's Date: 11/05/2020    History of Present Illness Per MD note, pt is a 75 y.o.female who presents with a several year history of progressively worsening right knee pain. Pt is s/p R TKA. PMH includes HTN, A-fib, obesity, anemia, and pulmonary embolus.    PT Comments    Pt was pleasant and motivated to participate during the session. Pt reported only mild light headedness in standing activities. Pt performed functional mobility with min guard-min assist with intermittent verbal cueing for sequencing. Pt still limited by pain and generalized weakness. Pt will benefit from PT services in a SNF setting upon discharge to safely address deficits listed in patient problem list for decreased caregiver assistance and eventual return to PLOF.     Follow Up Recommendations  SNF;Supervision/Assistance - 24 hour     Equipment Recommendations  Rolling walker with 5" wheels;3in1 (PT)    Recommendations for Other Services       Precautions / Restrictions Precautions Precautions: Fall;Knee Precaution Booklet Issued: Yes (comment) Restrictions Weight Bearing Restrictions: Yes RLE Weight Bearing: Weight bearing as tolerated    Mobility  Bed Mobility Overal bed mobility: Needs Assistance Bed Mobility: Supine to Sit     Supine to sit: Min assist Sit to supine: Min assist   General bed mobility comments: Min assist for RLE    Transfers Overall transfer level: Needs assistance Equipment used: Rolling walker (2 wheeled) Transfers: Sit to/from Stand Sit to Stand: Min assist         General transfer comment: Pt pushed through UEs through walker to stand without cueing. Verbal cues for sequencing  Ambulation/Gait Ambulation/Gait assistance: Min guard Gait Distance (Feet): 40 Feet Assistive device: Rolling walker (2 wheeled) Gait Pattern/deviations: Decreased step length -  left;Decreased stance time - right;Decreased weight shift to right;Wide base of support;Step-to pattern;Step-through pattern Gait velocity: decreased   General Gait Details: Verbal cues for sequencing and walker/hand placement. Pt transitioned from step-to to step-through pattern towards end of walk and increased speed.   Stairs             Wheelchair Mobility    Modified Rankin (Stroke Patients Only)       Balance Overall balance assessment: Needs assistance Sitting-balance support: Feet supported Sitting balance-Leahy Scale: Good     Standing balance support: Bilateral upper extremity supported;No upper extremity supported Standing balance-Leahy Scale: Fair Standing balance comment: Reliance on UEs through RW                            Cognition Arousal/Alertness: Awake/alert Behavior During Therapy: WFL for tasks assessed/performed;Flat affect Overall Cognitive Status: Within Functional Limits for tasks assessed                                        Exercises Total Joint Exercises Ankle Circles/Pumps: AROM;Strengthening;Both;Supine;15 reps Quad Sets: AROM;Strengthening;Both;Supine;15 reps Hip ABduction/ADduction: AAROM;Strengthening;Right;10 reps;Supine Straight Leg Raises: AAROM;Strengthening;Right;10 reps;Supine Knee Flexion: AROM;Strengthening;10 reps;Seated;Right Goniometric ROM: R knee AROM 8-76 Marching in Standing: AROM;Strengthening;Both;10 reps;Standing Other Exercises Other Exercises: Static standing 1-62min for improved activity tolerance. Other Exercises: Static sitting 3-5 min for improved trunk control and balance.    General Comments        Pertinent Vitals/Pain Pain Assessment: 0-10 Pain Score: 4  Pain Location: R knee with activity Pain Descriptors /  Indicators: Aching;Sore Pain Intervention(s): Limited activity within patient's tolerance;Monitored during session;Ice applied;RN gave pain meds during session     Home Living                      Prior Function            PT Goals (current goals can now be found in the care plan section) Progress towards PT goals: Progressing toward goals    Frequency    BID      PT Plan Current plan remains appropriate    Co-evaluation              AM-PAC PT "6 Clicks" Mobility   Outcome Measure  Help needed turning from your back to your side while in a flat bed without using bedrails?: None Help needed moving from lying on your back to sitting on the side of a flat bed without using bedrails?: A Little Help needed moving to and from a bed to a chair (including a wheelchair)?: A Little Help needed standing up from a chair using your arms (e.g., wheelchair or bedside chair)?: A Little Help needed to walk in hospital room?: A Little Help needed climbing 3-5 steps with a railing? : A Lot 6 Click Score: 18    End of Session Equipment Utilized During Treatment: Gait belt Activity Tolerance: Patient tolerated treatment well Patient left: in bed;with call bell/phone within reach;with bed alarm set;with SCD's reapplied;Other (comment) (Polar care applied to RLE) Nurse Communication: Mobility status PT Visit Diagnosis: Unsteadiness on feet (R26.81);Other abnormalities of gait and mobility (R26.89);Muscle weakness (generalized) (M62.81);Pain Pain - Right/Left: Right Pain - part of body: Knee     Time: 2707-8675 PT Time Calculation (min) (ACUTE ONLY): 40 min  Charges:                        Dayton Scrape SPT 11/05/20, 1:15 PM

## 2020-11-05 NOTE — Progress Notes (Signed)
Contacted [facility] and reported to facility nurse, Janora Norlander, Therapist, sports.

## 2020-11-05 NOTE — Care Management Important Message (Signed)
Important Message  Patient Details  Name: Laura Zhang MRN: [id number] Date of Birth: 12-07-1945   Medicare Important Message Given:  Yes     Dannette Barbara 11/05/2020, 11:57 AM

## 2020-11-05 NOTE — Progress Notes (Signed)
EMS transported pt from [facility] to [facility]

## 2020-11-05 NOTE — Progress Notes (Signed)
Subjective: 3 Days Post-Op Procedure(s) (LRB): TOTAL KNEE ARTHROPLASTY (Right) Patient reports pain as moderate in the right knee.  Patient is well but is progressing very slowly with therapy. Plan is to go Skilled nursing facility after hospital stay.  Plan for discharge to [place] today. Negative for chest pain and shortness of breath Fever: no Gastrointestinal:Negative for nausea and vomiting Patient is passing gas this AM, has not had a BM yet.  Objective: Vital signs in last 24 hours: Temp:  [98.4 F (36.9 C)-98.9 F (37.2 C)] 98.4 F (36.9 C) (07/01 0503) Pulse Rate:  [77-93] 93 (07/01 0503) Resp:  [17-20] 20 (07/01 0503) BP: (130-167)/(59-72) 158/68 (07/01 0503) SpO2:  [98 %-100 %] 100 % (07/01 0503)  Intake/Output from previous day:  Intake/Output Summary (Last 24 hours) at 11/05/2020 0759 Last data filed at 11/04/2020 1411 Gross per 24 hour  Intake 480 ml  Output --  Net 480 ml    Intake/Output this shift: No intake/output data recorded.  Labs: Recent Labs    11/03/20 0508 11/04/20 0518  HGB 9.0* 9.4*   Recent Labs    11/03/20 0508 11/04/20 0518  WBC 4.7 7.0  RBC 3.18* 3.37*  HCT 26.8* 27.6*  PLT 153 166   Recent Labs    11/04/20 0518 11/05/20 0412  NA 129* 125*  K 4.1 4.0  CL 99 95*  CO2 25 26  BUN 14 15  CREATININE 0.79 0.74  GLUCOSE 143* 126*  CALCIUM 8.7* 8.7*   No results for input(s): LABPT, INR in the last 72 hours.   EXAM General - Patient is Alert, Appropriate, and Oriented Extremity - ABD soft Neurovascular intact Sensation intact distally Intact pulses distally Dorsiflexion/Plantar flexion intact Incision: dressing C/D/I No cellulitis present Dressing/Incision - clean, dry, no drainage, ACE wrap intact to the right leg. Motor Function - intact, moving foot and toes well on exam.  Abdomen soft with normal bowel sounds this AM. Negative homans to the right leg.  Past Medical History:  Diagnosis Date   Anemia    Arthritis     Atrial fibrillation (HCC)    Constipation    GERD (gastroesophageal reflux disease)    Hemorrhoids    History of pulmonary embolus (PE)    Hyperlipidemia    Hypertension    Vitamin D deficiency     Assessment/Plan: 3 Days Post-Op Procedure(s) (LRB): TOTAL KNEE ARTHROPLASTY (Right) Active Problems:   Status post total knee replacement using cement, right  Estimated body mass index is 30.79 kg/m as calculated from the following:   Height as of this encounter: 5' 4.5" (1.638 m).   Weight as of this encounter: 82.6 kg. Advance diet Up with therapy D/C IV fluids when tolerating po intake.  Labs reviewed this AM, Na 125, encouraged increased oral intake. No recent fevers.  Continue to work on BM.  Move to suppository today. Patient progressing slowly with PT.  Unsafe for d/c home at this time. Plan for SNF today. Plan for discharge to SNF today pending a bowel movement.  DVT Prophylaxis - Foot Pumps, TED hose, and Eliquis Weight-Bearing as tolerated to right leg  J. Cameron Proud, PA-C [facility] 11/05/2020, 7:59 AM

## 2020-11-05 NOTE — TOC Progression Note (Signed)
Transition of Care [facility]) - Progression Note    Patient Details  Name: ZEN FELLING MRN: [id number] Date of Birth: 08-Sep-1945  Transition of Care [facility]) CM/SW [place], RN Phone Number: 11/05/2020, 10:03 AM  Clinical Narrative:     Patient to go to room 802 at [facility], [facility] to pick up at 1230 if she is able to have a BM prior, the nurse to call report to [facility]  Expected Discharge Plan: [place] Barriers to Discharge: Barriers Resolved  Expected Discharge Plan and Services Expected Discharge Plan: [place]   Discharge Planning Services: CM Consult   Living arrangements for the past 2 months: Single Family Home Expected Discharge Date: 11/05/20               DME Arranged: Gilford Rile rolling, 3-N-1 DME Agency: [employer] Date DME Agency Contacted: 11/03/20 Time DME Agency Contacted: [phone] Representative spoke with at DME Agency: [place]: PT [place]: [facility] Date [place]: 11/03/20 Time [place]: 0920 Representative spoke with at [place]: [place]   Social Determinants of Health ([place]) Interventions    Readmission Risk Interventions No flowsheet data found.

## 2021-01-19 ENCOUNTER — Other Ambulatory Visit: Payer: Self-pay

## 2021-01-19 ENCOUNTER — Encounter: Payer: Self-pay | Admitting: Oncology

## 2021-01-19 ENCOUNTER — Inpatient Hospital Stay: Payer: Medicare Other

## 2021-01-19 ENCOUNTER — Inpatient Hospital Stay: Payer: Medicare Other | Attending: Oncology | Admitting: Oncology

## 2021-01-19 VITALS — BP 116/61 | HR 73 | Temp 97.1°F | Resp 16 | Wt 170.5 lb

## 2021-01-19 DIAGNOSIS — D708 Other neutropenia: Secondary | ICD-10-CM

## 2021-01-19 DIAGNOSIS — E785 Hyperlipidemia, unspecified: Secondary | ICD-10-CM | POA: Diagnosis not present

## 2021-01-19 DIAGNOSIS — Z8249 Family history of ischemic heart disease and other diseases of the circulatory system: Secondary | ICD-10-CM | POA: Diagnosis not present

## 2021-01-19 DIAGNOSIS — M1712 Unilateral primary osteoarthritis, left knee: Secondary | ICD-10-CM | POA: Insufficient documentation

## 2021-01-19 DIAGNOSIS — R5383 Other fatigue: Secondary | ICD-10-CM | POA: Diagnosis not present

## 2021-01-19 DIAGNOSIS — Z8349 Family history of other endocrine, nutritional and metabolic diseases: Secondary | ICD-10-CM | POA: Diagnosis not present

## 2021-01-19 DIAGNOSIS — D709 Neutropenia, unspecified: Secondary | ICD-10-CM | POA: Diagnosis not present

## 2021-01-19 DIAGNOSIS — Z811 Family history of alcohol abuse and dependence: Secondary | ICD-10-CM | POA: Diagnosis not present

## 2021-01-19 DIAGNOSIS — I10 Essential (primary) hypertension: Secondary | ICD-10-CM | POA: Insufficient documentation

## 2021-01-19 DIAGNOSIS — Z79899 Other long term (current) drug therapy: Secondary | ICD-10-CM | POA: Insufficient documentation

## 2021-01-19 DIAGNOSIS — D649 Anemia, unspecified: Secondary | ICD-10-CM | POA: Insufficient documentation

## 2021-01-19 DIAGNOSIS — Z833 Family history of diabetes mellitus: Secondary | ICD-10-CM | POA: Insufficient documentation

## 2021-01-19 DIAGNOSIS — K219 Gastro-esophageal reflux disease without esophagitis: Secondary | ICD-10-CM | POA: Insufficient documentation

## 2021-01-19 DIAGNOSIS — Z8719 Personal history of other diseases of the digestive system: Secondary | ICD-10-CM | POA: Diagnosis not present

## 2021-01-19 DIAGNOSIS — Z8261 Family history of arthritis: Secondary | ICD-10-CM | POA: Diagnosis not present

## 2021-01-19 DIAGNOSIS — Z86711 Personal history of pulmonary embolism: Secondary | ICD-10-CM | POA: Insufficient documentation

## 2021-01-19 LAB — CBC WITH DIFFERENTIAL/PLATELET
Abs Immature Granulocytes: 0.01 10*3/uL (ref 0.00–0.07)
Basophils Absolute: 0 10*3/uL (ref 0.0–0.1)
Basophils Relative: 1 %
Eosinophils Absolute: 0.2 10*3/uL (ref 0.0–0.5)
Eosinophils Relative: 6 %
HCT: 34.1 % — ABNORMAL LOW (ref 36.0–46.0)
Hemoglobin: 11.6 g/dL — ABNORMAL LOW (ref 12.0–15.0)
Immature Granulocytes: 0 %
Lymphocytes Relative: 26 %
Lymphs Abs: 0.7 10*3/uL (ref 0.7–4.0)
MCH: 27.8 pg (ref 26.0–34.0)
MCHC: 34 g/dL (ref 30.0–36.0)
MCV: 81.8 fL (ref 80.0–100.0)
Monocytes Absolute: 0.3 10*3/uL (ref 0.1–1.0)
Monocytes Relative: 11 %
Neutro Abs: 1.6 10*3/uL — ABNORMAL LOW (ref 1.7–7.7)
Neutrophils Relative %: 56 %
Platelets: 240 10*3/uL (ref 150–400)
RBC: 4.17 MIL/uL (ref 3.87–5.11)
RDW: 13.2 % (ref 11.5–15.5)
WBC: 2.8 10*3/uL — ABNORMAL LOW (ref 4.0–10.5)
nRBC: 0 % (ref 0.0–0.2)

## 2021-01-19 LAB — IRON AND TIBC
Iron: 99 ug/dL (ref 28–170)
Saturation Ratios: 30 % (ref 10.4–31.8)
TIBC: 330 ug/dL (ref 250–450)
UIBC: 231 ug/dL

## 2021-01-19 LAB — VITAMIN B12: Vitamin B-12: 346 pg/mL (ref 180–914)

## 2021-01-19 LAB — TECHNOLOGIST SMEAR REVIEW

## 2021-01-19 LAB — COMPREHENSIVE METABOLIC PANEL
ALT: 11 U/L (ref 0–44)
AST: 19 U/L (ref 15–41)
Albumin: 4.4 g/dL (ref 3.5–5.0)
Alkaline Phosphatase: 55 U/L (ref 38–126)
Anion gap: 9 (ref 5–15)
BUN: 9 mg/dL (ref 8–23)
CO2: 25 mmol/L (ref 22–32)
Calcium: 9.7 mg/dL (ref 8.9–10.3)
Chloride: 96 mmol/L — ABNORMAL LOW (ref 98–111)
Creatinine, Ser: 0.81 mg/dL (ref 0.44–1.00)
GFR, Estimated: >60 mL/min (ref >60)
Glucose, Bld: 113 mg/dL — ABNORMAL HIGH (ref 70–99)
Potassium: 3.2 mmol/L — ABNORMAL LOW (ref 3.5–5.1)
Sodium: 130 mmol/L — ABNORMAL LOW (ref 135–145)
Total Bilirubin: 1.6 mg/dL — ABNORMAL HIGH (ref 0.3–1.2)
Total Protein: 6.9 g/dL (ref 6.5–8.1)

## 2021-01-19 LAB — RETICULOCYTES
Immature Retic Fract: 10.3 % (ref 2.3–15.9)
RBC.: 4.1 MIL/uL (ref 3.87–5.11)
Retic Count, Absolute: 74.2 10*3/uL (ref 19.0–186.0)
Retic Ct Pct: 1.8 % (ref 0.4–3.1)

## 2021-01-19 LAB — LACTATE DEHYDROGENASE: LDH: 118 U/L (ref 98–192)

## 2021-01-19 LAB — TSH: TSH: 0.692 u[IU]/mL (ref 0.350–4.500)

## 2021-01-19 LAB — HEPATITIS C ANTIBODY: HCV Ab: NONREACTIVE

## 2021-01-19 LAB — HIV ANTIBODY (ROUTINE TESTING W REFLEX): HIV Screen 4th Generation wRfx: NONREACTIVE

## 2021-01-19 LAB — FOLATE: Folate: 28 ng/mL (ref >5.9)

## 2021-01-19 LAB — SEDIMENTATION RATE: Sed Rate: 1 mm/hr (ref 0–30)

## 2021-01-19 LAB — FERRITIN: Ferritin: 32 ng/mL (ref 11–307)

## 2021-01-20 LAB — ANA COMPREHENSIVE PANEL
Anti JO-1: <0.2 AI (ref 0.0–0.9)
Centromere Ab Screen: <0.2 AI (ref 0.0–0.9)
Chromatin Ab SerPl-aCnc: <0.2 AI (ref 0.0–0.9)
ENA SM Ab Ser-aCnc: <0.2 AI (ref 0.0–0.9)
Ribonucleic Protein: 0.2 AI (ref 0.0–0.9)
SSA (Ro) (ENA) Antibody, IgG: <0.2 AI (ref 0.0–0.9)
SSB (La) (ENA) Antibody, IgG: <0.2 AI (ref 0.0–0.9)
Scleroderma (Scl-70) (ENA) Antibody, IgG: <0.2 AI (ref 0.0–0.9)
ds DNA Ab: 1 IU/mL (ref 0–9)

## 2021-01-20 LAB — HAPTOGLOBIN: Haptoglobin: 60 mg/dL (ref 42–346)

## 2021-01-22 LAB — MULTIPLE MYELOMA PANEL, SERUM
Albumin SerPl Elph-Mcnc: 4 g/dL (ref 2.9–4.4)
Albumin/Glob SerPl: 1.8 — ABNORMAL HIGH (ref 0.7–1.7)
Alpha 1: 0.2 g/dL (ref 0.0–0.4)
Alpha2 Glob SerPl Elph-Mcnc: 0.5 g/dL (ref 0.4–1.0)
B-Globulin SerPl Elph-Mcnc: 0.8 g/dL (ref 0.7–1.3)
Gamma Glob SerPl Elph-Mcnc: 0.7 g/dL (ref 0.4–1.8)
Globulin, Total: 2.3 g/dL (ref 2.2–3.9)
IgA: 130 mg/dL (ref 64–422)
IgG (Immunoglobin G), Serum: 818 mg/dL (ref 586–1602)
IgM (Immunoglobulin M), Srm: 59 mg/dL (ref 26–217)
Total Protein ELP: 6.3 g/dL (ref 6.0–8.5)

## 2021-01-23 NOTE — Progress Notes (Signed)
Hematology/Oncology Consult note [facility] Telephone:([phone] [phone]  Patient Care Team: Sofie Hartigan, MD as PCP - General (Family Medicine)   Name of the patient: Laura Zhang  [id number]  02/21/1946    Reason for referral- anemia   Referring physician- Dr. Ellison Hughs   Date of visit: 01/23/21   History of presenting illness- patient is a 75 year old female with a past medical history significant for hypertension hyperlipidemia GERD who has been referred to [place] for anemia.Your most recent CBC from 12/09/2020 showed white count of 2.9, H&H of 10.6/33.1 with an MCV of 87 and normal platelet count.  Differential showed ANC of 1.6.  Looking back at her prior CBCs patient's hemoglobin was normal between 12.5-13.5 back in 2019And slowly drifted down to 10.6 presently.  Also white count has slowly drifted down from a baseline of 5-2.9 presently.  Patient reports that her appetite is good.  Denies any unintentional weight loss.  Reports ongoing fatigue.  ECOG PS- 1  Pain scale- 0   Review of systems- Review of Systems  Constitutional:  Positive for malaise/fatigue. Negative for chills, fever and weight loss.  HENT:  Negative for congestion, ear discharge and nosebleeds.   Eyes:  Negative for blurred vision.  Respiratory:  Negative for cough, hemoptysis, sputum production, shortness of breath and wheezing.   Cardiovascular:  Negative for chest pain, palpitations, orthopnea and claudication.  Gastrointestinal:  Negative for abdominal pain, blood in stool, constipation, diarrhea, heartburn, melena, nausea and vomiting.  Genitourinary:  Negative for dysuria, flank pain, frequency, hematuria and urgency.  Musculoskeletal:  Negative for back pain, joint pain and myalgias.  Skin:  Negative for rash.  Neurological:  Negative for dizziness, tingling, focal weakness, seizures, weakness and headaches.  Endo/Heme/Allergies:  Does not bruise/bleed  easily.  Psychiatric/Behavioral:  Negative for depression and suicidal ideas. The patient does not have insomnia.    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[place]) 04/30/2016   Left hip pain 10/12/2014   Primary osteoarthritis of left knee 10/12/2014   GERD (gastroesophageal reflux disease) 02/25/2014   Hyperlipidemia, unspecified 02/25/2014   Impaired glucose tolerance 02/25/2014     Past Medical History:  Diagnosis Date   Anemia    Arthritis    Atrial fibrillation (HCC)    Constipation    GERD (gastroesophageal reflux disease)    Hemorrhoids    History of pulmonary embolus (PE)    Hyperlipidemia    Hypertension    Vitamin D deficiency      Past Surgical History:  Procedure Laterality Date   ABDOMINAL HYSTERECTOMY     CARDIAC CATHETERIZATION     1999 per patient   CARPAL TUNNEL RELEASE Left    CATARACT EXTRACTION, BILATERAL     COLONOSCOPY     COLONOSCOPY WITH PROPOFOL N/A 06/05/2018   Procedure: COLONOSCOPY WITH PROPOFOL;  Surgeon: Manya Silvas, MD;  Location: [facility] ENDOSCOPY;  Service: Endoscopy;  Laterality: N/A;   ESOPHAGOGASTRODUODENOSCOPY (EGD) WITH PROPOFOL N/A 03/24/2015   Procedure: ESOPHAGOGASTRODUODENOSCOPY (EGD) WITH PROPOFOL;  Surgeon: Manya Silvas, MD;  Location: [facility] ENDOSCOPY;  Service: Endoscopy;  Laterality: N/A;   ESOPHAGOGASTRODUODENOSCOPY (EGD) WITH PROPOFOL N/A 07/24/2019   Procedure: ESOPHAGOGASTRODUODENOSCOPY (EGD) WITH PROPOFOL;  Surgeon: Alice Reichert, Benay Pike, MD;  Location: [facility] ENDOSCOPY;  Service: Gastroenterology;  Laterality: N/A;   great toe fusion Left    JOINT REPLACEMENT     Left knee   KNEE CLOSED REDUCTION Left 05/31/2016   Procedure: CLOSED MANIPULATION KNEE;   Surgeon: Leanor Kail, MD;  Location: [facility] ORS;  Service: Orthopedics;  Laterality: Left;   TOTAL KNEE ARTHROPLASTY Right 11/02/2020   Procedure: TOTAL KNEE ARTHROPLASTY;  Surgeon: Corky Mull, MD;  Location: [facility] ORS;  Service: Orthopedics;  Laterality: Right;    Social History   Socioeconomic History   Marital status: Widowed    Spouse name: Not on file   Number of children: Not on file   Years of education: Not on file   Highest education level: Not on file  Occupational History   Not on file  Tobacco Use   Smoking status: Never   Smokeless tobacco: Never  Vaping Use   Vaping Use: Never used  Substance and Sexual Activity   Alcohol use: No   Drug use: No   Sexual activity: Yes  Other Topics Concern   Not on file  Social History Narrative   Not on file   Social Determinants of Health   Financial Resource Strain: Not on file  Food Insecurity: Not on file  Transportation Needs: Not on file  Physical Activity: Not on file  Stress: Not on file  Social Connections: Not on file  Intimate Partner Violence: Not on file     Family History  Problem Relation Age of Onset   Rheum arthritis Mother    Diabetes Mother    Hypertension Father    Alcohol abuse Father    Heart attack Father    Hypertension Sister    Diabetes Sister    Hypothyroidism Sister    Breast cancer Neg Hx      Current Outpatient Medications:    acetaminophen (TYLENOL) 650 MG CR tablet, Take 650 mg by mouth in the morning and at bedtime., Disp: , Rfl:    apixaban (ELIQUIS) 5 MG TABS tablet, Take 5 mg by mouth 2 (two) times daily., Disp: , Rfl:    chlorthalidone (HYGROTON) 25 MG tablet, Take 25 mg by mouth in the morning., Disp: , Rfl:    Cholecalciferol (VITAMIN D) 50 MCG (2000 UT) tablet, Take 2,000 Units by mouth in the morning., Disp: , Rfl:    Cholecalciferol 25 MCG (1000 UT) capsule, Take by mouth., Disp: , Rfl:    hydrALAZINE (APRESOLINE) 100 MG tablet, Take 100 mg by mouth 2 (two) times  daily., Disp: , Rfl:    labetalol (NORMODYNE) 200 MG tablet, Take 200 mg by mouth 2 (two) times daily., Disp: , Rfl:    losartan (COZAAR) 100 MG tablet, Take 100 mg by mouth in the morning., Disp: , Rfl:    lovastatin (MEVACOR) 40 MG tablet, Take by mouth., Disp: , Rfl:    pantoprazole (PROTONIX) 40 MG tablet, Take by mouth., Disp: , Rfl:    Propylene Glycol (SYSTANE COMPLETE) 0.6 % SOLN, Place 1 drop into both eyes daily as needed (dry eyes)., Disp: , Rfl:    hydrALAZINE (APRESOLINE) 50 MG tablet, Take by mouth. (Patient not taking: Reported on 01/19/2021), Disp: , Rfl:    labetalol (NORMODYNE) 100 MG tablet, Take by mouth. (Patient not taking: Reported on 01/19/2021), Disp: , Rfl:    ondansetron (ZOFRAN) 4 MG tablet, Take 1 tablet (4 mg total) by mouth every 6 (six) hours as needed for nausea. (Patient not taking: Reported on 01/19/2021), Disp:  30 tablet, Rfl: 0   oxyCODONE (OXY IR/ROXICODONE) 5 MG immediate release tablet, Take 1-2 tablets (5-10 mg total) by mouth every 4 (four) hours as needed for moderate pain (pain score 4-6). (Patient not taking: Reported on 01/19/2021), Disp: 60 tablet, Rfl: 0   traMADol (ULTRAM) 50 MG tablet, Take 1 tablet (50 mg total) by mouth every 6 (six) hours as needed for moderate pain. (Patient not taking: Reported on 01/19/2021), Disp: 30 tablet, Rfl: 0   Physical exam:  Vitals:   01/19/21 1041  BP: 116/61  Pulse: 73  Resp: 16  Temp: (!) 97.1 F (36.2 C)  SpO2: 98%  Weight: 170 lb 8.4 oz (77.3 kg)   Physical Exam Cardiovascular:     Rate and Rhythm: Normal rate and regular rhythm.     Heart sounds: Normal heart sounds.  Pulmonary:     Effort: Pulmonary effort is normal.     Breath sounds: Normal breath sounds.  Abdominal:     General: Bowel sounds are normal.     Palpations: Abdomen is soft.     Comments: No palpable hepatosplenomegaly  Lymphadenopathy:     Comments: No palpable cervical, supraclavicular, axillary or inguinal adenopathy    Skin:     General: Skin is warm and dry.  Neurological:     Mental Status: She is alert and oriented to person, place, and time.       CMP Latest Ref Rng & Units 01/19/2021  Glucose 70 - 99 mg/dL 113(H)  BUN 8 - 23 mg/dL 9  Creatinine 0.44 - 1.00 mg/dL 0.81  Sodium 135 - 145 mmol/L 130(L)  Potassium 3.5 - 5.1 mmol/L 3.2(L)  Chloride 98 - 111 mmol/L 96(L)  CO2 22 - 32 mmol/L 25  Calcium 8.9 - 10.3 mg/dL 9.7  Total Protein 6.5 - 8.1 g/dL 6.9  Total Bilirubin 0.3 - 1.2 mg/dL 1.6(H)  Alkaline Phos 38 - 126 U/L 55  AST 15 - 41 U/L 19  ALT 0 - 44 U/L 11   CBC Latest Ref Rng & Units 01/19/2021  WBC 4.0 - 10.5 K/uL 2.8(L)  Hemoglobin 12.0 - 15.0 g/dL 11.6(L)  Hematocrit 36.0 - 46.0 % 34.1(L)  Platelets 150 - 400 K/uL 240   Assessment and plan- Patient is a 75 y.o. female referred for normocytic anemia   On looking at her CBC patient has mild normocytic anemia along with mild neutropenia as well.  Platelet counts are normal.  I will do a comprehensive work-up today including ferritin and iron studies B12 folate reticulocyte count myeloma panel haptoglobin TSH ANA comprehensive panel HIV and hepatitis C testing as well as smear review.  If there is a consistent downward trend in her white count and hemoglobin without a clear peripheral cause I will consider doing a bone marrow biopsy at that time.   Thank you for this kind referral and the opportunity to participate in the care of this patient   Visit Diagnosis 1. Other neutropenia ([place])   2. Normocytic anemia     Dr. Randa Evens, MD, MPH [facility] at [facility] [phone] 01/23/2021 4:15 PM

## 2021-02-02 ENCOUNTER — Inpatient Hospital Stay (HOSPITAL_BASED_OUTPATIENT_CLINIC_OR_DEPARTMENT_OTHER): Payer: Medicare Other | Admitting: Oncology

## 2021-02-02 ENCOUNTER — Other Ambulatory Visit: Payer: Self-pay

## 2021-02-02 ENCOUNTER — Encounter: Payer: Self-pay | Admitting: Oncology

## 2021-02-02 DIAGNOSIS — D708 Other neutropenia: Secondary | ICD-10-CM | POA: Diagnosis not present

## 2021-02-02 DIAGNOSIS — D649 Anemia, unspecified: Secondary | ICD-10-CM | POA: Diagnosis not present

## 2021-02-02 NOTE — Progress Notes (Signed)
I connected with Laura Zhang on 02/02/21 at 10:30 AM EDT by video enabled telemedicine visit and verified that I am speaking with the correct person using two identifiers.   I discussed the limitations, risks, security and privacy concerns of performing an evaluation and management service by telemedicine and the availability of in-person appointments. I also discussed with the patient that there may be a patient responsible charge related to this service. The patient expressed understanding and agreed to proceed.  Other persons participating in the visit and their role in the encounter:  none  Patient's location:  home Provider's location:  work  Risk analyst Complaint: Discuss results of blood work  History of present illness: patient is a 75 year old female with a past medical history significant for hypertension hyperlipidemia GERD who has been referred to [place] for anemia.Your most recent CBC from 12/09/2020 showed white count of 2.9, H&H of 10.6/33.1 with an MCV of 87 and normal platelet count.  Differential showed ANC of 1.6.  Looking back at her prior CBCs patient's hemoglobin was normal between 12.5-13.5 back in 2019And slowly drifted down to 10.6 presently.  Also white count has slowly drifted down from a baseline of 5-2.9 presently.  Patient reports that her appetite is good.  Denies any unintentional weight loss.  Reports ongoing fatigue.  Results of blood work from 01/19/2021 were as follows: White count was low at 2.8 with an ANC of 1.6.  H&H 11.6/34.1 with an MCV of 81 and a platelet count of 240.  B12 and folate TSH normal.  HIV and hepatitis C testing negative.  ANA comprehensive panel negative.  ESR normal and LDH normal.  Ferritin levels normal at 32 and iron saturation 30%.  Myeloma panel showed no M protein.  Smear review showed some subtle smudge cells and giant platelets but no other abnormality.  Interval history reports doing well overall.  Ongoing fatigue which is unchanged but  denies other complaints at this time.  Denies any recurrent infections or hospitalizations   Review of Systems  Constitutional:  Positive for malaise/fatigue. Negative for chills, fever and weight loss.  HENT:  Negative for congestion, ear discharge and nosebleeds.   Eyes:  Negative for blurred vision.  Respiratory:  Negative for cough, hemoptysis, sputum production, shortness of breath and wheezing.   Cardiovascular:  Negative for chest pain, palpitations, orthopnea and claudication.  Gastrointestinal:  Negative for abdominal pain, blood in stool, constipation, diarrhea, heartburn, melena, nausea and vomiting.  Genitourinary:  Negative for dysuria, flank pain, frequency, hematuria and urgency.  Musculoskeletal:  Negative for back pain, joint pain and myalgias.  Skin:  Negative for rash.  Neurological:  Negative for dizziness, tingling, focal weakness, seizures, weakness and headaches.  Endo/Heme/Allergies:  Does not bruise/bleed easily.  Psychiatric/Behavioral:  Negative for depression and suicidal ideas. The patient does not have insomnia.    No Known Allergies  Past Medical History:  Diagnosis Date   Anemia    Arthritis    Atrial fibrillation (HCC)    Constipation    GERD (gastroesophageal reflux disease)    Hemorrhoids    History of pulmonary embolus (PE)    Hyperlipidemia    Hypertension    Vitamin D deficiency     Past Surgical History:  Procedure Laterality Date   ABDOMINAL HYSTERECTOMY     CARDIAC CATHETERIZATION     1999 per patient   CARPAL TUNNEL RELEASE Left    CATARACT EXTRACTION, BILATERAL     COLONOSCOPY     COLONOSCOPY WITH PROPOFOL  N/A 06/05/2018   Procedure: COLONOSCOPY WITH PROPOFOL;  Surgeon: Manya Silvas, MD;  Location: [facility] ENDOSCOPY;  Service: Endoscopy;  Laterality: N/A;   ESOPHAGOGASTRODUODENOSCOPY (EGD) WITH PROPOFOL N/A 03/24/2015   Procedure: ESOPHAGOGASTRODUODENOSCOPY (EGD) WITH PROPOFOL;  Surgeon: Manya Silvas, MD;  Location: [facility]  ENDOSCOPY;  Service: Endoscopy;  Laterality: N/A;   ESOPHAGOGASTRODUODENOSCOPY (EGD) WITH PROPOFOL N/A 07/24/2019   Procedure: ESOPHAGOGASTRODUODENOSCOPY (EGD) WITH PROPOFOL;  Surgeon: [place], Benay Pike, MD;  Location: [facility] ENDOSCOPY;  Service: Gastroenterology;  Laterality: N/A;   great toe fusion Left    JOINT REPLACEMENT     Left knee   KNEE CLOSED REDUCTION Left 05/31/2016   Procedure: CLOSED MANIPULATION KNEE;  Surgeon: Leanor Kail, MD;  Location: [facility] ORS;  Service: Orthopedics;  Laterality: Left;   TOTAL KNEE ARTHROPLASTY Right 11/02/2020   Procedure: TOTAL KNEE ARTHROPLASTY;  Surgeon: Corky Mull, MD;  Location: [facility] ORS;  Service: Orthopedics;  Laterality: Right;    Social History   Socioeconomic History   Marital status: Widowed    Spouse name: Not on file   Number of children: Not on file   Years of education: Not on file   Highest education level: Not on file  Occupational History   Not on file  Tobacco Use   Smoking status: Never   Smokeless tobacco: Never  Vaping Use   Vaping Use: Never used  Substance and Sexual Activity   Alcohol use: No   Drug use: No   Sexual activity: Yes  Other Topics Concern   Not on file  Social History Narrative   Not on file   Social Determinants of Health   Financial Resource Strain: Not on file  Food Insecurity: Not on file  Transportation Needs: Not on file  Physical Activity: Not on file  Stress: Not on file  Social Connections: Not on file  Intimate Partner Violence: Not on file    Family History  Problem Relation Age of Onset   Rheum arthritis Mother    Diabetes Mother    Hypertension Father    Alcohol abuse Father    Heart attack Father    Hypertension Sister    Diabetes Sister    Hypothyroidism Sister    Breast cancer Neg Hx      Current Outpatient Medications:    acetaminophen (TYLENOL) 650 MG CR tablet, Take 650 mg by mouth in the morning and at bedtime., Disp: , Rfl:    apixaban (ELIQUIS) 5 MG TABS  tablet, Take 5 mg by mouth 2 (two) times daily., Disp: , Rfl:    chlorthalidone (HYGROTON) 25 MG tablet, Take 25 mg by mouth in the morning., Disp: , Rfl:    Cholecalciferol (VITAMIN D) 50 MCG (2000 UT) tablet, Take 2,000 Units by mouth in the morning., Disp: , Rfl:    Cholecalciferol 25 MCG (1000 UT) capsule, Take by mouth., Disp: , Rfl:    hydrALAZINE (APRESOLINE) 100 MG tablet, Take 100 mg by mouth 2 (two) times daily., Disp: , Rfl:    labetalol (NORMODYNE) 200 MG tablet, Take 200 mg by mouth 2 (two) times daily., Disp: , Rfl:    losartan (COZAAR) 100 MG tablet, Take 100 mg by mouth in the morning., Disp: , Rfl:    lovastatin (MEVACOR) 40 MG tablet, Take by mouth., Disp: , Rfl:    pantoprazole (PROTONIX) 40 MG tablet, Take by mouth., Disp: , Rfl:    Propylene Glycol (SYSTANE COMPLETE) 0.6 % SOLN, Place 1 drop into both eyes daily as needed (  dry eyes)., Disp: , Rfl:    hydrALAZINE (APRESOLINE) 50 MG tablet, Take by mouth. (Patient not taking: No sig reported), Disp: , Rfl:    labetalol (NORMODYNE) 100 MG tablet, Take by mouth. (Patient not taking: Reported on 01/19/2021), Disp: , Rfl:    ondansetron (ZOFRAN) 4 MG tablet, Take 1 tablet (4 mg total) by mouth every 6 (six) hours as needed for nausea. (Patient not taking: No sig reported), Disp: 30 tablet, Rfl: 0   oxyCODONE (OXY IR/ROXICODONE) 5 MG immediate release tablet, Take 1-2 tablets (5-10 mg total) by mouth every 4 (four) hours as needed for moderate pain (pain score 4-6). (Patient not taking: No sig reported), Disp: 60 tablet, Rfl: 0   traMADol (ULTRAM) 50 MG tablet, Take 1 tablet (50 mg total) by mouth every 6 (six) hours as needed for moderate pain. (Patient not taking: Reported on 02/02/2021), Disp: 30 tablet, Rfl: 0  No results found.  No images are attached to the encounter.   CMP Latest Ref Rng & Units 01/19/2021  Glucose 70 - 99 mg/dL 113(H)  BUN 8 - 23 mg/dL 9  Creatinine 0.44 - 1.00 mg/dL 0.81  Sodium 135 - 145 mmol/L 130(L)   Potassium 3.5 - 5.1 mmol/L 3.2(L)  Chloride 98 - 111 mmol/L 96(L)  CO2 22 - 32 mmol/L 25  Calcium 8.9 - 10.3 mg/dL 9.7  Total Protein 6.5 - 8.1 g/dL 6.9  Total Bilirubin 0.3 - 1.2 mg/dL 1.6(H)  Alkaline Phos 38 - 126 U/L 55  AST 15 - 41 U/L 19  ALT 0 - 44 U/L 11   CBC Latest Ref Rng & Units 01/19/2021  WBC 4.0 - 10.5 K/uL 2.8(L)  Hemoglobin 12.0 - 15.0 g/dL 11.6(L)  Hematocrit 36.0 - 46.0 % 34.1(L)  Platelets 150 - 400 K/uL 240     Observation/objective: Appears in no acute distress over video visit today.  Breathing is nonlabored  Assessment and plan: Patient is a 75 year old female and this is a follow-up of following issues:  Neutropenia: Mildly worse as compared to 3 months ago.  ANC 1.6.  HIV hepatitis C testing smear review unremarkable.  B12 and folate normal.  I am inclined to monitor this conservatively with a repeat CBC in 6 weeks and I will see her back in 12 weeks.  If there is a consistent downward trend in her white count I will consider doing a bone marrow biopsy at that time. Mild normocytic anemia: Iron studies B12 folate TSH myeloma panel reticulocyte count haptoglobin normal.  Continue to monitor and no need for a bone marrow biopsy at this time  Follow-up instructions:  I discussed the assessment and treatment plan with the patient. The patient was provided an opportunity to ask questions and all were answered. The patient agreed with the plan and demonstrated an understanding of the instructions.   The patient was advised to call back or seek an in-person evaluation if the symptoms worsen or if the condition fails to improve as anticipated.    Visit Diagnosis: 1. Other neutropenia ([place])   2. Normocytic anemia     Dr. Randa Evens, MD, MPH [facility] at [facility] Tel- [phone] 02/02/2021 12:32 PM

## 2021-02-21 ENCOUNTER — Other Ambulatory Visit: Payer: Self-pay | Admitting: Family Medicine

## 2021-02-21 DIAGNOSIS — Z1231 Encounter for screening mammogram for malignant neoplasm of breast: Secondary | ICD-10-CM

## 2021-03-16 ENCOUNTER — Inpatient Hospital Stay: Payer: Medicare Other | Attending: Oncology

## 2021-03-16 ENCOUNTER — Other Ambulatory Visit: Payer: Self-pay

## 2021-03-16 DIAGNOSIS — D708 Other neutropenia: Secondary | ICD-10-CM | POA: Insufficient documentation

## 2021-03-16 DIAGNOSIS — D649 Anemia, unspecified: Secondary | ICD-10-CM | POA: Diagnosis not present

## 2021-03-16 LAB — CBC WITH DIFFERENTIAL/PLATELET
Abs Immature Granulocytes: 0.01 10*3/uL (ref 0.00–0.07)
Basophils Absolute: 0 10*3/uL (ref 0.0–0.1)
Basophils Relative: 1 %
Eosinophils Absolute: 0.2 10*3/uL (ref 0.0–0.5)
Eosinophils Relative: 6 %
HCT: 32 % — ABNORMAL LOW (ref 36.0–46.0)
Hemoglobin: 10.8 g/dL — ABNORMAL LOW (ref 12.0–15.0)
Immature Granulocytes: 0 %
Lymphocytes Relative: 26 %
Lymphs Abs: 0.7 10*3/uL (ref 0.7–4.0)
MCH: 27.9 pg (ref 26.0–34.0)
MCHC: 33.8 g/dL (ref 30.0–36.0)
MCV: 82.7 fL (ref 80.0–100.0)
Monocytes Absolute: 0.4 10*3/uL (ref 0.1–1.0)
Monocytes Relative: 13 %
Neutro Abs: 1.4 10*3/uL — ABNORMAL LOW (ref 1.7–7.7)
Neutrophils Relative %: 54 %
Platelets: 217 10*3/uL (ref 150–400)
RBC: 3.87 MIL/uL (ref 3.87–5.11)
RDW: 14.2 % (ref 11.5–15.5)
WBC: 2.6 10*3/uL — ABNORMAL LOW (ref 4.0–10.5)
nRBC: 0 % (ref 0.0–0.2)

## 2021-03-17 LAB — ANA COMPREHENSIVE PANEL
Anti JO-1: <0.2 AI (ref 0.0–0.9)
Centromere Ab Screen: <0.2 AI (ref 0.0–0.9)
Chromatin Ab SerPl-aCnc: 0.2 AI (ref 0.0–0.9)
ENA SM Ab Ser-aCnc: <0.2 AI (ref 0.0–0.9)
Ribonucleic Protein: 0.4 AI (ref 0.0–0.9)
SSA (Ro) (ENA) Antibody, IgG: <0.2 AI (ref 0.0–0.9)
SSB (La) (ENA) Antibody, IgG: <0.2 AI (ref 0.0–0.9)
Scleroderma (Scl-70) (ENA) Antibody, IgG: <0.2 AI (ref 0.0–0.9)
ds DNA Ab: 1 IU/mL (ref 0–9)

## 2021-05-11 ENCOUNTER — Inpatient Hospital Stay: Payer: Medicare Other | Attending: Oncology

## 2021-05-11 ENCOUNTER — Inpatient Hospital Stay (HOSPITAL_BASED_OUTPATIENT_CLINIC_OR_DEPARTMENT_OTHER): Payer: Medicare Other | Admitting: Oncology

## 2021-05-11 ENCOUNTER — Other Ambulatory Visit: Payer: Self-pay

## 2021-05-11 ENCOUNTER — Encounter: Payer: Self-pay | Admitting: Oncology

## 2021-05-11 VITALS — BP 112/59 | HR 67 | Temp 98.2°F | Resp 18 | Wt 177.0 lb

## 2021-05-11 DIAGNOSIS — Z79899 Other long term (current) drug therapy: Secondary | ICD-10-CM | POA: Insufficient documentation

## 2021-05-11 DIAGNOSIS — Z833 Family history of diabetes mellitus: Secondary | ICD-10-CM | POA: Insufficient documentation

## 2021-05-11 DIAGNOSIS — Z8261 Family history of arthritis: Secondary | ICD-10-CM | POA: Diagnosis not present

## 2021-05-11 DIAGNOSIS — Z7901 Long term (current) use of anticoagulants: Secondary | ICD-10-CM | POA: Diagnosis not present

## 2021-05-11 DIAGNOSIS — Z8719 Personal history of other diseases of the digestive system: Secondary | ICD-10-CM | POA: Diagnosis not present

## 2021-05-11 DIAGNOSIS — Z86711 Personal history of pulmonary embolism: Secondary | ICD-10-CM | POA: Insufficient documentation

## 2021-05-11 DIAGNOSIS — I10 Essential (primary) hypertension: Secondary | ICD-10-CM | POA: Insufficient documentation

## 2021-05-11 DIAGNOSIS — R5383 Other fatigue: Secondary | ICD-10-CM | POA: Insufficient documentation

## 2021-05-11 DIAGNOSIS — E785 Hyperlipidemia, unspecified: Secondary | ICD-10-CM | POA: Insufficient documentation

## 2021-05-11 DIAGNOSIS — D708 Other neutropenia: Secondary | ICD-10-CM

## 2021-05-11 DIAGNOSIS — Z811 Family history of alcohol abuse and dependence: Secondary | ICD-10-CM | POA: Insufficient documentation

## 2021-05-11 DIAGNOSIS — Z8249 Family history of ischemic heart disease and other diseases of the circulatory system: Secondary | ICD-10-CM | POA: Diagnosis not present

## 2021-05-11 DIAGNOSIS — D709 Neutropenia, unspecified: Secondary | ICD-10-CM | POA: Insufficient documentation

## 2021-05-11 DIAGNOSIS — Z8349 Family history of other endocrine, nutritional and metabolic diseases: Secondary | ICD-10-CM | POA: Diagnosis not present

## 2021-05-11 DIAGNOSIS — K219 Gastro-esophageal reflux disease without esophagitis: Secondary | ICD-10-CM | POA: Diagnosis not present

## 2021-05-11 LAB — CBC WITH DIFFERENTIAL/PLATELET
Abs Immature Granulocytes: 0.01 10*3/uL (ref 0.00–0.07)
Basophils Absolute: 0 10*3/uL (ref 0.0–0.1)
Basophils Relative: 1 %
Eosinophils Absolute: 0.2 10*3/uL (ref 0.0–0.5)
Eosinophils Relative: 5 %
HCT: 33.9 % — ABNORMAL LOW (ref 36.0–46.0)
Hemoglobin: 11.1 g/dL — ABNORMAL LOW (ref 12.0–15.0)
Immature Granulocytes: 0 %
Lymphocytes Relative: 28 %
Lymphs Abs: 0.8 10*3/uL (ref 0.7–4.0)
MCH: 27.6 pg (ref 26.0–34.0)
MCHC: 32.7 g/dL (ref 30.0–36.0)
MCV: 84.3 fL (ref 80.0–100.0)
Monocytes Absolute: 0.4 10*3/uL (ref 0.1–1.0)
Monocytes Relative: 13 %
Neutro Abs: 1.6 10*3/uL — ABNORMAL LOW (ref 1.7–7.7)
Neutrophils Relative %: 53 %
Platelets: 227 10*3/uL (ref 150–400)
RBC: 4.02 MIL/uL (ref 3.87–5.11)
RDW: 13.6 % (ref 11.5–15.5)
WBC: 3 10*3/uL — ABNORMAL LOW (ref 4.0–10.5)
nRBC: 0 % (ref 0.0–0.2)

## 2021-05-11 NOTE — Progress Notes (Signed)
Hematology/Oncology Consult note [facility]  [phone] [phone]  Patient Care Team: Sofie Hartigan, MD as PCP - General (Family Medicine)   Name of the patient: Laura Zhang  [id number]  1946-01-31   Date of visit: 05/11/21  Diagnosis-neutropenia likely benign  Chief complaint/ Reason for visit-routine follow-up of neutropenia  Heme/Onc history: patient is a 76 year old female with a past medical history significant for hypertension hyperlipidemia GERD who has been referred to [place] for anemia.Your most recent CBC from 12/09/2020 showed white count of 2.9, H&H of 10.6/33.1 with an MCV of 87 and normal platelet count.  Differential showed ANC of 1.6.  Looking back at her prior CBCs patient's hemoglobin was normal between 12.5-13.5 back in 2019And slowly drifted down to 10.6 presently.  Also white count has slowly drifted down from a baseline of 5-2.9 presently.   Interval history-reports mild chronic fatigue but denies new complaints at this time.  Appetite and weight have remained stable.  No recurrent infections.  ECOG PS- 1 Pain scale- 0   Review of systems- Review of Systems  Constitutional:  Positive for malaise/fatigue. Negative for chills, fever and weight loss.  HENT:  Negative for congestion, ear discharge and nosebleeds.   Eyes:  Negative for blurred vision.  Respiratory:  Negative for cough, hemoptysis, sputum production, shortness of breath and wheezing.   Cardiovascular:  Negative for chest pain, palpitations, orthopnea and claudication.  Gastrointestinal:  Negative for abdominal pain, blood in stool, constipation, diarrhea, heartburn, melena, nausea and vomiting.  Genitourinary:  Negative for dysuria, flank pain, frequency, hematuria and urgency.  Musculoskeletal:  Negative for back pain, joint pain and myalgias.  Skin:  Negative for rash.  Neurological:  Negative for dizziness, tingling, focal weakness, seizures,  weakness and headaches.  Endo/Heme/Allergies:  Does not bruise/bleed easily.  Psychiatric/Behavioral:  Negative for depression and suicidal ideas. The patient does not have insomnia.      No Known Allergies   Past Medical History:  Diagnosis Date   Anemia    Arthritis    Atrial fibrillation (HCC)    Constipation    GERD (gastroesophageal reflux disease)    Hemorrhoids    History of pulmonary embolus (PE)    Hyperlipidemia    Hypertension    Vitamin D deficiency      Past Surgical History:  Procedure Laterality Date   ABDOMINAL HYSTERECTOMY     CARDIAC CATHETERIZATION     1999 per patient   CARPAL TUNNEL RELEASE Left    CATARACT EXTRACTION, BILATERAL     COLONOSCOPY     COLONOSCOPY WITH PROPOFOL N/A 06/05/2018   Procedure: COLONOSCOPY WITH PROPOFOL;  Surgeon: Manya Silvas, MD;  Location: [facility] ENDOSCOPY;  Service: Endoscopy;  Laterality: N/A;   ESOPHAGOGASTRODUODENOSCOPY (EGD) WITH PROPOFOL N/A 03/24/2015   Procedure: ESOPHAGOGASTRODUODENOSCOPY (EGD) WITH PROPOFOL;  Surgeon: Manya Silvas, MD;  Location: [facility] ENDOSCOPY;  Service: Endoscopy;  Laterality: N/A;   ESOPHAGOGASTRODUODENOSCOPY (EGD) WITH PROPOFOL N/A 07/24/2019   Procedure: ESOPHAGOGASTRODUODENOSCOPY (EGD) WITH PROPOFOL;  Surgeon: Toledo, Benay Pike, MD;  Location: [facility] ENDOSCOPY;  Service: Gastroenterology;  Laterality: N/A;   great toe fusion Left    JOINT REPLACEMENT     Left knee   KNEE CLOSED REDUCTION Left 05/31/2016   Procedure: CLOSED MANIPULATION KNEE;  Surgeon: Leanor Kail, MD;  Location: [facility] ORS;  Service: Orthopedics;  Laterality: Left;   TOTAL KNEE ARTHROPLASTY Right 11/02/2020   Procedure: TOTAL KNEE ARTHROPLASTY;  Surgeon: Corky Mull, MD;  Location:  [facility] ORS;  Service: Orthopedics;  Laterality: Right;    Social History   Socioeconomic History   Marital status: Widowed    Spouse name: Not on file   Number of children: Not on file   Years of education: Not on file   Highest  education level: Not on file  Occupational History   Not on file  Tobacco Use   Smoking status: Never   Smokeless tobacco: Never  Vaping Use   Vaping Use: Never used  Substance and Sexual Activity   Alcohol use: No   Drug use: No   Sexual activity: Yes  Other Topics Concern   Not on file  Social History Narrative   Not on file   Social Determinants of Health   Financial Resource Strain: Not on file  Food Insecurity: Not on file  Transportation Needs: Not on file  Physical Activity: Not on file  Stress: Not on file  Social Connections: Not on file  Intimate Partner Violence: Not on file    Family History  Problem Relation Age of Onset   Rheum arthritis Mother    Diabetes Mother    Hypertension Father    Alcohol abuse Father    Heart attack Father    Hypertension Sister    Diabetes Sister    Hypothyroidism Sister    Breast cancer Neg Hx      Current Outpatient Medications:    acetaminophen (TYLENOL) 650 MG CR tablet, Take 650 mg by mouth in the morning and at bedtime., Disp: , Rfl:    apixaban (ELIQUIS) 5 MG TABS tablet, Take 5 mg by mouth 2 (two) times daily., Disp: , Rfl:    chlorthalidone (HYGROTON) 25 MG tablet, Take 25 mg by mouth in the morning., Disp: , Rfl:    Cholecalciferol (VITAMIN D) 50 MCG (2000 UT) tablet, Take 2,000 Units by mouth in the morning., Disp: , Rfl:    hydrALAZINE (APRESOLINE) 100 MG tablet, Take 100 mg by mouth 2 (two) times daily., Disp: , Rfl:    labetalol (NORMODYNE) 200 MG tablet, Take 200 mg by mouth 2 (two) times daily., Disp: , Rfl:    losartan (COZAAR) 100 MG tablet, Take 100 mg by mouth in the morning., Disp: , Rfl:    lovastatin (MEVACOR) 40 MG tablet, Take by mouth., Disp: , Rfl:    Cholecalciferol 25 MCG (1000 UT) capsule, Take by mouth., Disp: , Rfl:    hydrALAZINE (APRESOLINE) 50 MG tablet, Take by mouth. (Patient not taking: No sig reported), Disp: , Rfl:    labetalol (NORMODYNE) 100 MG tablet, Take by mouth. (Patient not  taking: Reported on 01/19/2021), Disp: , Rfl:    ondansetron (ZOFRAN) 4 MG tablet, Take 1 tablet (4 mg total) by mouth every 6 (six) hours as needed for nausea. (Patient not taking: No sig reported), Disp: 30 tablet, Rfl: 0   oxyCODONE (OXY IR/ROXICODONE) 5 MG immediate release tablet, Take 1-2 tablets (5-10 mg total) by mouth every 4 (four) hours as needed for moderate pain (pain score 4-6). (Patient not taking: No sig reported), Disp: 60 tablet, Rfl: 0   pantoprazole (PROTONIX) 40 MG tablet, Take by mouth., Disp: , Rfl:    Propylene Glycol (SYSTANE COMPLETE) 0.6 % SOLN, Place 1 drop into both eyes daily as needed (dry eyes)., Disp: , Rfl:    traMADol (ULTRAM) 50 MG tablet, Take 1 tablet (50 mg total) by mouth every 6 (six) hours as needed for moderate pain. (Patient not taking: Reported on 02/02/2021), Disp:  30 tablet, Rfl: 0  Physical exam:  Vitals:   05/11/21 1057  BP: (!) 112/59  Pulse: 67  Resp: 18  Temp: 98.2 F (36.8 C)  TempSrc: Tympanic  Weight: 177 lb (80.3 kg)   Physical Exam Constitutional:      General: She is not in acute distress. Cardiovascular:     Rate and Rhythm: Normal rate and regular rhythm.     Heart sounds: Normal heart sounds.  Pulmonary:     Effort: Pulmonary effort is normal.     Breath sounds: Normal breath sounds.  Abdominal:     General: Bowel sounds are normal.     Palpations: Abdomen is soft.     Comments: No palpable hepatosplenomegaly  Lymphadenopathy:     Comments: No palpable cervical, supraclavicular, axillary or inguinal adenopathy    Skin:    General: Skin is warm and dry.  Neurological:     Mental Status: She is alert and oriented to person, place, and time.     CMP Latest Ref Rng & Units 01/19/2021  Glucose 70 - 99 mg/dL 113(H)  BUN 8 - 23 mg/dL 9  Creatinine 0.44 - 1.00 mg/dL 0.81  Sodium 135 - 145 mmol/L 130(L)  Potassium 3.5 - 5.1 mmol/L 3.2(L)  Chloride 98 - 111 mmol/L 96(L)  CO2 22 - 32 mmol/L 25  Calcium 8.9 - 10.3 mg/dL  9.7  Total Protein 6.5 - 8.1 g/dL 6.9  Total Bilirubin 0.3 - 1.2 mg/dL 1.6(H)  Alkaline Phos 38 - 126 U/L 55  AST 15 - 41 U/L 19  ALT 0 - 44 U/L 11   CBC Latest Ref Rng & Units 05/11/2021  WBC 4.0 - 10.5 K/uL 3.0(L)  Hemoglobin 12.0 - 15.0 g/dL 11.1(L)  Hematocrit 36.0 - 46.0 % 33.9(L)  Platelets 150 - 400 K/uL 227    Assessment and plan- Patient is a 76 y.o. female who is here for follow-up of chronic neutropenia  Patient's white cell count has been between 5-7 between 20 16-20 18.  She was noted to have a white count of around 4 up until November 2021 and since then she has had fluctuating leukopenia.  Her white count was 2.8 in September 2022 with an [place] of 1.6 followed by white count of 2.6 and ANC of 1.4 in November 2022.  Presently her white count is again improved to 3 with an ANC of 1.6.  Hemoglobin is remained stable around 11 and a platelet count is normal.  No new present medications or over-the-counter supplements.  Her neutropenia is mild and isolated in the absence of other cytopenias and therefore I will hold off on doing a bone marrow biopsy at this time.  Repeat CBC with differential in 3 months in 6 months and I will see her back in 6 months.  There is a persistent downward trend in her neutropenia we will consider a bone marrow biopsy at that time   Visit Diagnosis 1. Chronic neutropenia (HCC)      Dr. Randa Evens, MD, MPH [facility] at [facility] [phone] 05/11/2021 1:16 PM

## 2021-07-01 ENCOUNTER — Ambulatory Visit
Admission: RE | Admit: 2021-07-01 | Discharge: 2021-07-01 | Disposition: A | Discharge status: Home / Self Care | Payer: Medicare Other | Source: Ambulatory Visit | Attending: Family Medicine | Admitting: Family Medicine

## 2021-07-01 ENCOUNTER — Other Ambulatory Visit: Payer: Self-pay

## 2021-07-01 DIAGNOSIS — Z1231 Encounter for screening mammogram for malignant neoplasm of breast: Secondary | ICD-10-CM | POA: Insufficient documentation

## 2021-08-09 ENCOUNTER — Inpatient Hospital Stay: Payer: Medicare Other | Attending: Oncology

## 2021-08-09 DIAGNOSIS — D709 Neutropenia, unspecified: Secondary | ICD-10-CM | POA: Insufficient documentation

## 2021-08-09 LAB — CBC WITH DIFFERENTIAL/PLATELET
Abs Immature Granulocytes: 0.01 10*3/uL (ref 0.00–0.07)
Basophils Absolute: 0 10*3/uL (ref 0.0–0.1)
Basophils Relative: 1 %
Eosinophils Absolute: 0.2 10*3/uL (ref 0.0–0.5)
Eosinophils Relative: 5 %
HCT: 36.2 % (ref 36.0–46.0)
Hemoglobin: 11.7 g/dL — ABNORMAL LOW (ref 12.0–15.0)
Immature Granulocytes: 0 %
Lymphocytes Relative: 24 %
Lymphs Abs: 0.9 10*3/uL (ref 0.7–4.0)
MCH: 27.5 pg (ref 26.0–34.0)
MCHC: 32.3 g/dL (ref 30.0–36.0)
MCV: 85 fL (ref 80.0–100.0)
Monocytes Absolute: 0.4 10*3/uL (ref 0.1–1.0)
Monocytes Relative: 11 %
Neutro Abs: 2.3 10*3/uL (ref 1.7–7.7)
Neutrophils Relative %: 59 %
Platelets: 211 10*3/uL (ref 150–400)
RBC: 4.26 MIL/uL (ref 3.87–5.11)
RDW: 13.6 % (ref 11.5–15.5)
WBC: 3.8 10*3/uL — ABNORMAL LOW (ref 4.0–10.5)
nRBC: 0 % (ref 0.0–0.2)

## 2021-09-01 ENCOUNTER — Other Ambulatory Visit: Payer: Self-pay | Admitting: Family Medicine

## 2021-09-01 DIAGNOSIS — I999 Unspecified disorder of circulatory system: Secondary | ICD-10-CM

## 2021-09-06 ENCOUNTER — Ambulatory Visit
Admission: RE | Admit: 2021-09-06 | Discharge: 2021-09-06 | Disposition: A | Discharge status: Home / Self Care | Payer: Medicare Other | Source: Ambulatory Visit | Attending: Family Medicine | Admitting: Family Medicine

## 2021-09-06 DIAGNOSIS — I999 Unspecified disorder of circulatory system: Secondary | ICD-10-CM

## 2021-11-09 ENCOUNTER — Inpatient Hospital Stay: Payer: Medicare Other | Attending: Oncology

## 2021-11-09 DIAGNOSIS — Z86711 Personal history of pulmonary embolism: Secondary | ICD-10-CM | POA: Insufficient documentation

## 2021-11-09 DIAGNOSIS — Z79899 Other long term (current) drug therapy: Secondary | ICD-10-CM | POA: Insufficient documentation

## 2021-11-09 DIAGNOSIS — Z811 Family history of alcohol abuse and dependence: Secondary | ICD-10-CM | POA: Diagnosis not present

## 2021-11-09 DIAGNOSIS — R531 Weakness: Secondary | ICD-10-CM | POA: Diagnosis not present

## 2021-11-09 DIAGNOSIS — Z8261 Family history of arthritis: Secondary | ICD-10-CM | POA: Diagnosis not present

## 2021-11-09 DIAGNOSIS — D649 Anemia, unspecified: Secondary | ICD-10-CM | POA: Insufficient documentation

## 2021-11-09 DIAGNOSIS — Z833 Family history of diabetes mellitus: Secondary | ICD-10-CM | POA: Insufficient documentation

## 2021-11-09 DIAGNOSIS — R5383 Other fatigue: Secondary | ICD-10-CM | POA: Diagnosis not present

## 2021-11-09 DIAGNOSIS — Z7901 Long term (current) use of anticoagulants: Secondary | ICD-10-CM | POA: Diagnosis not present

## 2021-11-09 DIAGNOSIS — Z8349 Family history of other endocrine, nutritional and metabolic diseases: Secondary | ICD-10-CM | POA: Insufficient documentation

## 2021-11-09 DIAGNOSIS — I10 Essential (primary) hypertension: Secondary | ICD-10-CM | POA: Insufficient documentation

## 2021-11-09 DIAGNOSIS — K219 Gastro-esophageal reflux disease without esophagitis: Secondary | ICD-10-CM | POA: Insufficient documentation

## 2021-11-09 DIAGNOSIS — Z8249 Family history of ischemic heart disease and other diseases of the circulatory system: Secondary | ICD-10-CM | POA: Diagnosis not present

## 2021-11-09 DIAGNOSIS — D709 Neutropenia, unspecified: Secondary | ICD-10-CM

## 2021-11-09 DIAGNOSIS — E785 Hyperlipidemia, unspecified: Secondary | ICD-10-CM | POA: Insufficient documentation

## 2021-11-09 DIAGNOSIS — Z8719 Personal history of other diseases of the digestive system: Secondary | ICD-10-CM | POA: Diagnosis not present

## 2021-11-09 LAB — CBC WITH DIFFERENTIAL/PLATELET
Abs Immature Granulocytes: 0 10*3/uL (ref 0.00–0.07)
Basophils Absolute: 0 10*3/uL (ref 0.0–0.1)
Basophils Relative: 1 %
Eosinophils Absolute: 0.2 10*3/uL (ref 0.0–0.5)
Eosinophils Relative: 6 %
HCT: 33.9 % — ABNORMAL LOW (ref 36.0–46.0)
Hemoglobin: 11.6 g/dL — ABNORMAL LOW (ref 12.0–15.0)
Immature Granulocytes: 0 %
Lymphocytes Relative: 26 %
Lymphs Abs: 0.9 10*3/uL (ref 0.7–4.0)
MCH: 28 pg (ref 26.0–34.0)
MCHC: 34.2 g/dL (ref 30.0–36.0)
MCV: 81.9 fL (ref 80.0–100.0)
Monocytes Absolute: 0.4 10*3/uL (ref 0.1–1.0)
Monocytes Relative: 14 %
Neutro Abs: 1.8 10*3/uL (ref 1.7–7.7)
Neutrophils Relative %: 53 %
Platelets: 232 10*3/uL (ref 150–400)
RBC: 4.14 MIL/uL (ref 3.87–5.11)
RDW: 12.9 % (ref 11.5–15.5)
WBC: 3.3 10*3/uL — ABNORMAL LOW (ref 4.0–10.5)
nRBC: 0 % (ref 0.0–0.2)

## 2021-11-11 ENCOUNTER — Inpatient Hospital Stay (HOSPITAL_BASED_OUTPATIENT_CLINIC_OR_DEPARTMENT_OTHER): Payer: Medicare Other | Admitting: Medical Oncology

## 2021-11-11 DIAGNOSIS — R5383 Other fatigue: Secondary | ICD-10-CM

## 2021-11-11 DIAGNOSIS — D649 Anemia, unspecified: Secondary | ICD-10-CM | POA: Diagnosis not present

## 2021-11-11 DIAGNOSIS — D709 Neutropenia, unspecified: Secondary | ICD-10-CM

## 2021-11-11 NOTE — Progress Notes (Signed)
Attempted to call pt for rooming prior to [place] visit. No answer, left message.

## 2021-11-11 NOTE — Progress Notes (Signed)
I connected with Francella Solian on 11/11/21 at  2:30 PM EDT by video enabled telemedicine visit and verified that I am speaking with the correct person using two identifiers.   I discussed the limitations, risks, security and privacy concerns of performing an evaluation and management service by telemedicine and the availability of in-person appointments. I also discussed with the patient that there may be a patient responsible charge related to this service. The patient expressed understanding and agreed to proceed.  Other persons participating in the visit and their role in the encounter:  none  Patient's location:  home Provider's location:  work  Risk analyst Complaint: Discuss results of blood work  History of present illness: patient is a 76 year old female with a past medical history significant for hypertension hyperlipidemia GERD who has been referred to [place] for anemia.Your most recent CBC from 12/09/2020 showed white count of 2.9, H&H of 10.6/33.1 with an MCV of 87 and normal platelet count.  Differential showed ANC of 1.6.  Looking back at her prior CBCs patient's hemoglobin was normal between 12.5-13.5 back in 2019And slowly drifted down to 10.6 presently.  Also white count has slowly drifted down from a baseline of 5-2.9 presently.  Patient reports that her appetite is good.  Denies any unintentional weight loss.  Reports ongoing fatigue.  Results of blood work from 01/19/2021 were as follows: White count was low at 2.8 with an ANC of 1.6.  H&H 11.6/34.1 with an MCV of 81 and a platelet count of 240.  B12 and folate TSH normal.  HIV and hepatitis C testing negative.  ANA comprehensive panel negative.  ESR normal and LDH normal.  Ferritin levels normal at 32 and iron saturation 30%.  Myeloma panel showed no M protein.  Smear review showed some subtle smudge cells and giant platelets but no other abnormality.  Interval history reports doing well overall.  Has some chronic upper body weakness and  lower energy levels for the past few 1-2 months. She is stable able to perform ADLs. Her last visit with her PCP was in Feb. She was last seen by cardiology last week to go over the results of a recent echo which was stable. Per patient cardiology is not thinking fatigue is cardiac in nature. Last A1c was 06/27/2021 5.4%. Last B12 level was on 01/19/2022 which was 349. She has found that the B12 did not help much with her energy level. She does take vitamin D daily. She reports that she is sleeping ok some nights and have some trouble getting to sleep some nights. She reports that she reached out to her PCP regarding this and they are formulating a plan for further evaluation.    Review of Systems  Constitutional:  Positive for malaise/fatigue. Negative for chills, fever and weight loss.  HENT:  Negative for congestion, ear discharge and nosebleeds.   Eyes:  Negative for blurred vision.  Respiratory:  Negative for cough, hemoptysis, sputum production, shortness of breath and wheezing.   Cardiovascular:  Negative for chest pain, palpitations, orthopnea and claudication.  Gastrointestinal:  Negative for abdominal pain, blood in stool, constipation, diarrhea, heartburn, melena, nausea and vomiting.  Genitourinary:  Negative for dysuria, flank pain, frequency, hematuria and urgency.  Musculoskeletal:  Negative for back pain, joint pain and myalgias.  Skin:  Negative for rash.  Neurological:  Negative for dizziness, tingling, focal weakness, seizures, weakness and headaches.  Endo/Heme/Allergies:  Does not bruise/bleed easily.  Psychiatric/Behavioral:  Negative for depression and suicidal ideas. The patient does  not have insomnia.     No Known Allergies  Past Medical History:  Diagnosis Date   Anemia    Arthritis    Atrial fibrillation (HCC)    Constipation    GERD (gastroesophageal reflux disease)    Hemorrhoids    History of pulmonary embolus (PE)    Hyperlipidemia    Hypertension     Vitamin D deficiency     Past Surgical History:  Procedure Laterality Date   ABDOMINAL HYSTERECTOMY     CARDIAC CATHETERIZATION     1999 per patient   CARPAL TUNNEL RELEASE Left    CATARACT EXTRACTION, BILATERAL     COLONOSCOPY     COLONOSCOPY WITH PROPOFOL N/A 06/05/2018   Procedure: COLONOSCOPY WITH PROPOFOL;  Surgeon: Manya Silvas, MD;  Location: [facility] ENDOSCOPY;  Service: Endoscopy;  Laterality: N/A;   ESOPHAGOGASTRODUODENOSCOPY (EGD) WITH PROPOFOL N/A 03/24/2015   Procedure: ESOPHAGOGASTRODUODENOSCOPY (EGD) WITH PROPOFOL;  Surgeon: Manya Silvas, MD;  Location: [facility] ENDOSCOPY;  Service: Endoscopy;  Laterality: N/A;   ESOPHAGOGASTRODUODENOSCOPY (EGD) WITH PROPOFOL N/A 07/24/2019   Procedure: ESOPHAGOGASTRODUODENOSCOPY (EGD) WITH PROPOFOL;  Surgeon: Toledo, Benay Pike, MD;  Location: [facility] ENDOSCOPY;  Service: Gastroenterology;  Laterality: N/A;   great toe fusion Left    JOINT REPLACEMENT     Left knee   KNEE CLOSED REDUCTION Left 05/31/2016   Procedure: CLOSED MANIPULATION KNEE;  Surgeon: Leanor Kail, MD;  Location: [facility] ORS;  Service: Orthopedics;  Laterality: Left;   TOTAL KNEE ARTHROPLASTY Right 11/02/2020   Procedure: TOTAL KNEE ARTHROPLASTY;  Surgeon: Corky Mull, MD;  Location: [facility] ORS;  Service: Orthopedics;  Laterality: Right;    Social History   Socioeconomic History   Marital status: Widowed    Spouse name: Not on file   Number of children: Not on file   Years of education: Not on file   Highest education level: Not on file  Occupational History   Not on file  Tobacco Use   Smoking status: Never   Smokeless tobacco: Never  Vaping Use   Vaping Use: Never used  Substance and Sexual Activity   Alcohol use: No   Drug use: No   Sexual activity: Yes  Other Topics Concern   Not on file  Social History Narrative   Not on file   Social Determinants of Health   Financial Resource Strain: Not on file  Food Insecurity: Not on file  Transportation  Needs: Not on file  Physical Activity: Not on file  Stress: Not on file  Social Connections: Not on file  Intimate Partner Violence: Not on file    Family History  Problem Relation Age of Onset   Rheum arthritis Mother    Diabetes Mother    Hypertension Father    Alcohol abuse Father    Heart attack Father    Hypertension Sister    Diabetes Sister    Hypothyroidism Sister    Breast cancer Neg Hx      Current Outpatient Medications:    acetaminophen (TYLENOL) 650 MG CR tablet, Take 650 mg by mouth in the morning and at bedtime., Disp: , Rfl:    apixaban (ELIQUIS) 5 MG TABS tablet, Take 5 mg by mouth 2 (two) times daily., Disp: , Rfl:    chlorthalidone (HYGROTON) 25 MG tablet, Take 25 mg by mouth in the morning., Disp: , Rfl:    Cholecalciferol (VITAMIN D) 50 MCG (2000 UT) tablet, Take 2,000 Units by mouth in the morning., Disp: , Rfl:  Cholecalciferol 25 MCG (1000 UT) capsule, Take by mouth., Disp: , Rfl:    hydrALAZINE (APRESOLINE) 100 MG tablet, Take 100 mg by mouth 2 (two) times daily., Disp: , Rfl:    hydrALAZINE (APRESOLINE) 50 MG tablet, Take by mouth. (Patient not taking: No sig reported), Disp: , Rfl:    labetalol (NORMODYNE) 100 MG tablet, Take by mouth. (Patient not taking: Reported on 01/19/2021), Disp: , Rfl:    labetalol (NORMODYNE) 200 MG tablet, Take 200 mg by mouth 2 (two) times daily., Disp: , Rfl:    losartan (COZAAR) 100 MG tablet, Take 100 mg by mouth in the morning., Disp: , Rfl:    lovastatin (MEVACOR) 40 MG tablet, Take by mouth., Disp: , Rfl:    ondansetron (ZOFRAN) 4 MG tablet, Take 1 tablet (4 mg total) by mouth every 6 (six) hours as needed for nausea. (Patient not taking: No sig reported), Disp: 30 tablet, Rfl: 0   oxyCODONE (OXY IR/ROXICODONE) 5 MG immediate release tablet, Take 1-2 tablets (5-10 mg total) by mouth every 4 (four) hours as needed for moderate pain (pain score 4-6). (Patient not taking: No sig reported), Disp: 60 tablet, Rfl: 0    pantoprazole (PROTONIX) 40 MG tablet, Take by mouth., Disp: , Rfl:    Propylene Glycol (SYSTANE COMPLETE) 0.6 % SOLN, Place 1 drop into both eyes daily as needed (dry eyes)., Disp: , Rfl:    traMADol (ULTRAM) 50 MG tablet, Take 1 tablet (50 mg total) by mouth every 6 (six) hours as needed for moderate pain. (Patient not taking: Reported on 02/02/2021), Disp: 30 tablet, Rfl: 0  No results found.  No images are attached to the encounter.      Latest Ref Rng & Units 01/19/2021   11:38 AM  CMP  Glucose 70 - 99 mg/dL 113   BUN 8 - 23 mg/dL 9   Creatinine 0.44 - 1.00 mg/dL 0.81   Sodium 135 - 145 mmol/L 130   Potassium 3.5 - 5.1 mmol/L 3.2   Chloride 98 - 111 mmol/L 96   CO2 22 - 32 mmol/L 25   Calcium 8.9 - 10.3 mg/dL 9.7   Total Protein 6.5 - 8.1 g/dL 6.9   Total Bilirubin 0.3 - 1.2 mg/dL 1.6   Alkaline Phos 38 - 126 U/L 55   AST 15 - 41 U/L 19   ALT 0 - 44 U/L 11       Latest Ref Rng & Units 11/09/2021   11:16 AM  CBC  WBC 4.0 - 10.5 K/uL 3.3   Hemoglobin 12.0 - 15.0 g/dL 11.6   Hematocrit 36.0 - 46.0 % 33.9   Platelets 150 - 400 K/uL 232      Observation/objective: Appears in no acute distress over video visit today.  Breathing is nonlabored  Assessment and plan: Patient is a 76 year old female and this is a follow-up of following issues:  Neutropenia: Chronic in nature. Mildly worse as compared to 3 months ago.  WBC count 3.3 from 3.8 but ANC 1.8 which is improved from 1.4 at its lowest on 05/11/2021.  In the past her HIV,  hepatitis C testing were all negative and  B12 and folate were normal.  I am inclined to monitor this conservatively with a repeat CBC in 6 weeks and I will see her back in 12 weeks. Previously Dr. Janese Banks discussed that "If there is a consistent downward trend in her white count I will consider doing a bone marrow biopsy at that time."- will reach  out to see if she wishes to proceed forward with bone marrow biopsy or monitoring given that [place] has improved and  anemia is stable.  Mild normocytic anemia: Stable. Iron studies B12 folate TSH myeloma panel reticulocyte count haptoglobin normal in the past.  Chronic but worsening. Suggested continued cardiology follow up- according to patient they suggested stress test which I agree with. Continue PCP follow up as well.   Follow-up instructions:  I discussed the assessment and treatment plan with the patient. The patient was provided an opportunity to ask questions and all were answered. The patient agreed with the plan and demonstrated an understanding of the instructions.   The patient was advised to call back or seek an in-person evaluation if the symptoms worsen or if the condition fails to improve as anticipated.    Visit Diagnosis: 1. Chronic neutropenia (HCC)   2. Normocytic anemia   3. Other fatigue     Nelwyn Salisbury PA-C [place] at [facility] 11/11/2021 3:19 PM

## 2021-12-19 ENCOUNTER — Encounter: Payer: Self-pay | Admitting: *Deleted

## 2021-12-20 ENCOUNTER — Ambulatory Visit: Payer: Medicare Other | Admitting: Certified Registered Nurse Anesthetist

## 2021-12-20 ENCOUNTER — Encounter
Admission: RE | Disposition: A | Discharge status: Home / Self Care | Payer: Self-pay | Source: Home / Self Care | Attending: Gastroenterology

## 2021-12-20 ENCOUNTER — Ambulatory Visit
Admission: RE | Admit: 2021-12-20 | Discharge: 2021-12-20 | Disposition: A | Discharge status: Home / Self Care | Payer: Medicare Other | Attending: Gastroenterology | Admitting: Gastroenterology

## 2021-12-20 ENCOUNTER — Encounter: Payer: Self-pay | Admitting: *Deleted

## 2021-12-20 DIAGNOSIS — I10 Essential (primary) hypertension: Secondary | ICD-10-CM | POA: Diagnosis not present

## 2021-12-20 DIAGNOSIS — D122 Benign neoplasm of ascending colon: Secondary | ICD-10-CM | POA: Insufficient documentation

## 2021-12-20 DIAGNOSIS — Z7901 Long term (current) use of anticoagulants: Secondary | ICD-10-CM | POA: Insufficient documentation

## 2021-12-20 DIAGNOSIS — K640 First degree hemorrhoids: Secondary | ICD-10-CM | POA: Insufficient documentation

## 2021-12-20 DIAGNOSIS — Z96651 Presence of right artificial knee joint: Secondary | ICD-10-CM | POA: Insufficient documentation

## 2021-12-20 DIAGNOSIS — Z90710 Acquired absence of both cervix and uterus: Secondary | ICD-10-CM | POA: Insufficient documentation

## 2021-12-20 DIAGNOSIS — I4891 Unspecified atrial fibrillation: Secondary | ICD-10-CM | POA: Insufficient documentation

## 2021-12-20 DIAGNOSIS — K449 Diaphragmatic hernia without obstruction or gangrene: Secondary | ICD-10-CM | POA: Insufficient documentation

## 2021-12-20 DIAGNOSIS — K219 Gastro-esophageal reflux disease without esophagitis: Secondary | ICD-10-CM | POA: Insufficient documentation

## 2021-12-20 DIAGNOSIS — Z86711 Personal history of pulmonary embolism: Secondary | ICD-10-CM | POA: Insufficient documentation

## 2021-12-20 DIAGNOSIS — R1310 Dysphagia, unspecified: Secondary | ICD-10-CM | POA: Insufficient documentation

## 2021-12-20 DIAGNOSIS — E785 Hyperlipidemia, unspecified: Secondary | ICD-10-CM | POA: Insufficient documentation

## 2021-12-20 DIAGNOSIS — K5730 Diverticulosis of large intestine without perforation or abscess without bleeding: Secondary | ICD-10-CM | POA: Diagnosis not present

## 2021-12-20 DIAGNOSIS — R194 Change in bowel habit: Secondary | ICD-10-CM | POA: Diagnosis present

## 2021-12-20 HISTORY — PX: COLONOSCOPY WITH PROPOFOL: SHX5780

## 2021-12-20 HISTORY — PX: ESOPHAGOGASTRODUODENOSCOPY (EGD) WITH PROPOFOL: SHX5813

## 2021-12-20 SURGERY — COLONOSCOPY WITH PROPOFOL
Anesthesia: General

## 2021-12-20 MED ORDER — PROPOFOL 10 MG/ML IV BOLUS
INTRAVENOUS | Status: DC | PRN
  Administered 2021-12-20: 40 mg via INTRAVENOUS

## 2021-12-20 MED ORDER — EPHEDRINE SULFATE (PRESSORS) 50 MG/ML IJ SOLN
INTRAMUSCULAR | Status: DC | PRN
  Administered 2021-12-20: 10 mg via INTRAVENOUS
  Administered 2021-12-20 (×3): 5 mg via INTRAVENOUS

## 2021-12-20 MED ORDER — PROPOFOL 500 MG/50ML IV EMUL
INTRAVENOUS | Status: DC | PRN
  Administered 2021-12-20: 140 ug/kg/min via INTRAVENOUS

## 2021-12-20 MED ORDER — PHENYLEPHRINE 80 MCG/ML (10ML) SYRINGE FOR IV PUSH (FOR BLOOD PRESSURE SUPPORT)
PREFILLED_SYRINGE | INTRAVENOUS | Status: AC
  Filled 2021-12-20: qty 10

## 2021-12-20 MED ORDER — EPHEDRINE 5 MG/ML INJ
INTRAVENOUS | Status: AC
  Filled 2021-12-20: qty 5

## 2021-12-20 MED ORDER — GLYCOPYRROLATE 0.2 MG/ML IJ SOLN
INTRAMUSCULAR | Status: DC | PRN
  Administered 2021-12-20 (×2): .1 mg via INTRAVENOUS

## 2021-12-20 MED ORDER — GLYCOPYRROLATE 0.2 MG/ML IJ SOLN
INTRAMUSCULAR | Status: AC
  Filled 2021-12-20: qty 1

## 2021-12-20 MED ORDER — SODIUM CHLORIDE 0.9 % IV SOLN: INTRAVENOUS | Status: DC

## 2021-12-20 MED ORDER — PHENYLEPHRINE 80 MCG/ML (10ML) SYRINGE FOR IV PUSH (FOR BLOOD PRESSURE SUPPORT)
PREFILLED_SYRINGE | INTRAVENOUS | Status: DC | PRN
  Administered 2021-12-20: 160 ug via INTRAVENOUS
  Administered 2021-12-20 (×2): 80 ug via INTRAVENOUS
  Administered 2021-12-20 (×2): 160 ug via INTRAVENOUS

## 2021-12-20 MED ORDER — LIDOCAINE HCL (CARDIAC) PF 100 MG/5ML IV SOSY
PREFILLED_SYRINGE | INTRAVENOUS | Status: DC | PRN
  Administered 2021-12-20: 30 mg via INTRAVENOUS

## 2021-12-20 NOTE — Op Note (Addendum)
[facility] Gastroenterology Patient Name: Laura Zhang Procedure Date: 12/20/2021 9:14 AM MRN: [id number] Account #: [id number] Date of Birth: August 30, 1945 Admit Type: Outpatient Age: 76 Room: [facility] ENDO ROOM 1 Gender: Female Note Status: Finalized Instrument Name: Jasper Riling [id number] Procedure:             Colonoscopy Indications:           Change in bowel habits Providers:             Andrey Farmer MD, MD Referring MD:          Sofie Hartigan (Referring MD) Medicines:             Monitored Anesthesia Care Complications:         No immediate complications. Estimated blood loss:                         Minimal. Procedure:             Pre-Anesthesia Assessment:                        - Prior to the procedure, a History and Physical was                         performed, and patient medications and allergies were                         reviewed. The patient is competent. The risks and                         benefits of the procedure and the sedation options and                         risks were discussed with the patient. All questions                         were answered and informed consent was obtained.                         Patient identification and proposed procedure were                         verified by the physician, the nurse, the                         anesthesiologist, the anesthetist and the technician                         in the endoscopy suite. Mental Status Examination:                         alert and oriented. Airway Examination: normal                         oropharyngeal airway and neck mobility. Respiratory                         Examination: clear to auscultation. CV Examination:  normal. Prophylactic Antibiotics: The patient does not                         require prophylactic antibiotics. Prior                         Anticoagulants: The patient has taken Eliquis                          (apixaban), last dose was 4 days prior to procedure.                         ASA Grade Assessment: III - A patient with severe                         systemic disease. After reviewing the risks and                         benefits, the patient was deemed in satisfactory                         condition to undergo the procedure. The anesthesia                         plan was to use monitored anesthesia care (MAC).                         Immediately prior to administration of medications,                         the patient was re-assessed for adequacy to receive                         sedatives. The heart rate, respiratory rate, oxygen                         saturations, blood pressure, adequacy of pulmonary                         ventilation, and response to care were monitored                         throughout the procedure. The physical status of the                         patient was re-assessed after the procedure.                        After obtaining informed consent, the colonoscope was                         passed under direct vision. Throughout the procedure,                         the patient's blood pressure, pulse, and oxygen                         saturations were monitored continuously. The  Colonoscope was introduced through the anus and                         advanced to the the cecum, identified by appendiceal                         orifice and ileocecal valve. The colonoscopy was                         technically difficult and complex due to significant                         looping and a tortuous colon. Successful completion of                         the procedure was aided by applying abdominal                         pressure. The patient tolerated the procedure well.                         The quality of the bowel preparation was fair. Findings:      The perianal and digital rectal examinations were normal.      Four  sessile and semi-pedunculated polyps were found in the ascending       colon. The polyps were 2 to 8 mm in size. These polyps were removed with       a cold snare. Resection and retrieval were complete. Estimated blood       loss was minimal.      A few small-mouthed diverticula were found in the sigmoid colon.      Internal hemorrhoids were found during retroflexion. The hemorrhoids       were Grade I (internal hemorrhoids that do not prolapse).      The exam was otherwise without abnormality on direct and retroflexion       views. Impression:            - Preparation of the colon was fair.                        - Four 2 to 8 mm polyps in the ascending colon,                         removed with a cold snare. Resected and retrieved.                        - Diverticulosis in the sigmoid colon.                        - Internal hemorrhoids.                        - The examination was otherwise normal on direct and                         retroflexion views. Recommendation:        - Discharge patient to home.                        -  Resume previous diet.                        - Continue present medications.                        - Await pathology results.                        - Repeat colonoscopy in 1-2 years because the bowel                         preparation was suboptimal.                        - Return to referring physician as previously                         scheduled.                        - Resume Eliquis (apixaban) at prior dose tomorrow. Procedure Code(s):     --- Professional ---                        [phone], Colonoscopy, flexible; with removal of                         tumor(s), polyp(s), or other lesion(s) by snare                         technique Diagnosis Code(s):     --- Professional ---                        K64.0, First degree hemorrhoids                        K63.5, Polyp of colon                        R19.4, Change in bowel habit                         K57.30, Diverticulosis of large intestine without                         perforation or abscess without bleeding CPT copyright 2019 American [employer] Association. All rights reserved. The codes documented in this report are preliminary and upon coder review may  be revised to meet current compliance requirements. Andrey Farmer MD, MD 12/20/2021 10:15:09 AM Number of Addenda: 0 Note Initiated On: 12/20/2021 9:14 AM Estimated Blood Loss:  Estimated blood loss was minimal.      [facility]

## 2021-12-20 NOTE — Op Note (Signed)
[facility] Gastroenterology Patient Name: Laura Zhang Procedure Date: 12/20/2021 9:15 AM MRN: [id number] Account #: [id number] Date of Birth: 10-May-1945 Admit Type: Outpatient Age: 76 Room: [facility] ENDO ROOM 1 Gender: Female Note Status: Finalized Instrument Name: Upper Endoscope [id number] Procedure:             Upper GI endoscopy Indications:           Dysphagia Providers:             Andrey Farmer MD, MD Referring MD:          Sofie Hartigan (Referring MD) Medicines:             Monitored Anesthesia Care Complications:         No immediate complications. Procedure:             Pre-Anesthesia Assessment:                        - Prior to the procedure, a History and Physical was                         performed, and patient medications and allergies were                         reviewed. The patient is competent. The risks and                         benefits of the procedure and the sedation options and                         risks were discussed with the patient. All questions                         were answered and informed consent was obtained.                         Patient identification and proposed procedure were                         verified by the physician, the nurse, the                         anesthesiologist, the anesthetist and the technician                         in the endoscopy suite. Mental Status Examination:                         alert and oriented. Airway Examination: normal                         oropharyngeal airway and neck mobility. Respiratory                         Examination: clear to auscultation. CV Examination:                         normal. Prophylactic Antibiotics: The patient does not  require prophylactic antibiotics. Prior                         Anticoagulants: The patient has taken Eliquis                         (apixaban), last dose was 4 days prior to procedure.                          ASA Grade Assessment: III - A patient with severe                         systemic disease. After reviewing the risks and                         benefits, the patient was deemed in satisfactory                         condition to undergo the procedure. The anesthesia                         plan was to use monitored anesthesia care (MAC).                         Immediately prior to administration of medications,                         the patient was re-assessed for adequacy to receive                         sedatives. The heart rate, respiratory rate, oxygen                         saturations, blood pressure, adequacy of pulmonary                         ventilation, and response to care were monitored                         throughout the procedure. The physical status of the                         patient was re-assessed after the procedure.                        After obtaining informed consent, the endoscope was                         passed under direct vision. Throughout the procedure,                         the patient's blood pressure, pulse, and oxygen                         saturations were monitored continuously. The Endoscope                         was introduced through the mouth, and advanced to the  second part of duodenum. The upper GI endoscopy was                         accomplished without difficulty. The patient tolerated                         the procedure well. Findings:      The examined esophagus was normal.      A small hiatal hernia was present.      The entire examined stomach was normal.      The examined duodenum was normal. Impression:            - Normal esophagus.                        - Small hiatal hernia.                        - Normal stomach.                        - Normal examined duodenum.                        - No specimens collected. Recommendation:        - Perform a colonoscopy  today. Procedure Code(s):     --- Professional ---                        [phone], Esophagogastroduodenoscopy, flexible,                         transoral; diagnostic, including collection of                         specimen(s) by brushing or washing, when performed                         (separate procedure) Diagnosis Code(s):     --- Professional ---                        K44.9, Diaphragmatic hernia without obstruction or                         gangrene                        R13.10, Dysphagia, unspecified CPT copyright 2019 American Medical Association. All rights reserved. The codes documented in this report are preliminary and upon coder review may  be revised to meet current compliance requirements. Andrey Farmer MD, MD 12/20/2021 10:11:03 AM Number of Addenda: 0 Note Initiated On: 12/20/2021 9:15 AM Estimated Blood Loss:  Estimated blood loss: none.      [facility]

## 2021-12-20 NOTE — Interval H&P Note (Signed)
History and Physical Interval Note:  12/20/2021 9:11 AM  Laura Zhang  has presented today for surgery, with the diagnosis of Bowel habit changes H/O Adenomatous Polyps of Colon Dysphagia Esophageal Stenosis GERD.  The various methods of treatment have been discussed with the patient and family. After consideration of risks, benefits and other options for treatment, the patient has consented to  Procedure(s): COLONOSCOPY WITH PROPOFOL (N/A) ESOPHAGOGASTRODUODENOSCOPY (EGD) WITH PROPOFOL (N/A) as a surgical intervention.  The patient's history has been reviewed, patient examined, no change in status, stable for surgery.  I have reviewed the patient's chart and labs.  Questions were answered to the patient's satisfaction.     Lesly Rubenstein  Ok to proceed with EGD/Colonoscopy

## 2021-12-20 NOTE — Anesthesia Procedure Notes (Signed)
Procedure Name: MAC Date/Time: 12/20/2021 9:07 AM  Performed by: Tollie Eth, CRNAPre-anesthesia Checklist: Patient identified, Emergency Drugs available, Suction available and Patient being monitored Patient Re-evaluated:Patient Re-evaluated prior to induction Oxygen Delivery Method: Nasal cannula Induction Type: IV induction Placement Confirmation: positive ETCO2

## 2021-12-20 NOTE — Anesthesia Preprocedure Evaluation (Signed)
Anesthesia Evaluation  Patient identified by MRN, date of birth, ID band Patient awake    Reviewed: Allergy & Precautions, NPO status , Patient's Chart, lab work & pertinent test results  History of Anesthesia Complications Negative for: history of anesthetic complications  Airway Mallampati: II  TM Distance: >3 FB Neck ROM: full    Dental  (+) Missing, Upper Dentures, Dental Advidsory Given   Pulmonary neg pulmonary ROS, neg shortness of breath, neg recent URI,    Pulmonary exam normal        Cardiovascular hypertension, (-) Past MI and (-) CABG + dysrhythmias Atrial Fibrillation (-) pacemaker     Neuro/Psych negative neurological ROS  negative psych ROS   GI/Hepatic negative GI ROS, Neg liver ROS,   Endo/Other  negative endocrine ROS  Renal/GU negative Renal ROS  negative genitourinary   Musculoskeletal   Abdominal   Peds  Hematology negative hematology ROS (+)   Anesthesia Other Findings Past Medical History: No date: Anemia No date: Arthritis No date: Atrial fibrillation (HCC) No date: Constipation No date: GERD (gastroesophageal reflux disease) No date: Hemorrhoids No date: History of pulmonary embolus (PE) No date: Hyperlipidemia No date: Hypertension No date: Vitamin D deficiency  Past Surgical History: No date: ABDOMINAL HYSTERECTOMY No date: CARDIAC CATHETERIZATION     Comment:  1999 per patient No date: CARPAL TUNNEL RELEASE; Left No date: CATARACT EXTRACTION, BILATERAL No date: COLONOSCOPY 06/05/2018: COLONOSCOPY WITH PROPOFOL; N/A     Comment:  Procedure: COLONOSCOPY WITH PROPOFOL;  Surgeon: Manya Silvas, MD;  Location: [facility] ENDOSCOPY;  Service:               Endoscopy;  Laterality: N/A; 03/24/2015: ESOPHAGOGASTRODUODENOSCOPY (EGD) WITH PROPOFOL; N/A     Comment:  Procedure: ESOPHAGOGASTRODUODENOSCOPY (EGD) WITH               PROPOFOL;  Surgeon: Manya Silvas, MD;   Location: [facility]              ENDOSCOPY;  Service: Endoscopy;  Laterality: N/A; 07/24/2019: ESOPHAGOGASTRODUODENOSCOPY (EGD) WITH PROPOFOL; N/A     Comment:  Procedure: ESOPHAGOGASTRODUODENOSCOPY (EGD) WITH               PROPOFOL;  Surgeon: Toledo, Benay Pike, MD;  Location:               [facility] ENDOSCOPY;  Service: Gastroenterology;  Laterality:               N/A; No date: great toe fusion; Left No date: JOINT REPLACEMENT     Comment:  Left knee 05/31/2016: KNEE CLOSED REDUCTION; Left     Comment:  Procedure: CLOSED MANIPULATION KNEE;  Surgeon: Leanor Kail, MD;  Location: [facility] ORS;  Service: Orthopedics;              Laterality: Left; 11/02/2020: TOTAL KNEE ARTHROPLASTY; Right     Comment:  Procedure: TOTAL KNEE ARTHROPLASTY;  Surgeon: Corky Mull, MD;  Location: [facility] ORS;  Service: Orthopedics;                Laterality: Right;  BMI    Body Mass Index: 30.90 kg/m      Reproductive/Obstetrics negative OB ROS  Anesthesia Physical Anesthesia Plan  ASA: 3  Anesthesia Plan: General   Post-op Pain Management: Minimal or no pain anticipated   Induction: Intravenous  PONV Risk Score and Plan: 3 and Propofol infusion, TIVA and Ondansetron  Airway Management Planned: Nasal Cannula  Additional Equipment: None  Intra-op Plan:   Post-operative Plan:   Informed Consent: I have reviewed the patients History and Physical, chart, labs and discussed the procedure including the risks, benefits and alternatives for the proposed anesthesia with the patient or authorized representative who has indicated his/her understanding and acceptance.     Dental advisory given  Plan Discussed with: CRNA and Surgeon  Anesthesia Plan Comments: (Discussed risks of anesthesia with patient, including possibility of difficulty with spontaneous ventilation under anesthesia necessitating airway intervention, PONV, and  rare risks such as cardiac or respiratory or neurological events, and allergic reactions. Discussed the role of CRNA in patient's perioperative care. Patient understands.)        Anesthesia Quick Evaluation

## 2021-12-20 NOTE — H&P (Signed)
Outpatient short stay form Pre-procedure 12/20/2021  Lesly Rubenstein, MD  Primary Physician: Sofie Hartigan, MD  Reason for visit:  Dysphagia/Change in bowel habits  History of present illness:    76 y/o lady with history of hypertension, GERD, and a. Fib on eliquis with last dose being 3 days ago. No family history of GI malignancies. History of hysterectomy. Last colonoscopy in 2020 with small TA.    Current Facility-Administered Medications:    0.9 %  sodium chloride infusion, , Intravenous, Continuous, Nakai Pollio, Hilton Cork, MD, Last Rate: 20 mL/hr at 12/20/21 0849, New Bag at 12/20/21 0849  Medications Prior to Admission  Medication Sig Dispense Refill Last Dose   chlorthalidone (HYGROTON) 25 MG tablet Take 25 mg by mouth in the morning.   12/20/2021   Cholecalciferol (VITAMIN D) 50 MCG (2000 UT) tablet Take 2,000 Units by mouth in the morning.   Past Week   Cholecalciferol 25 MCG (1000 UT) capsule Take by mouth.   Past Week   hydrALAZINE (APRESOLINE) 100 MG tablet Take 100 mg by mouth 2 (two) times daily.   12/20/2021   labetalol (NORMODYNE) 100 MG tablet Take by mouth.   12/20/2021   labetalol (NORMODYNE) 200 MG tablet Take 200 mg by mouth 2 (two) times daily.   12/20/2021   losartan (COZAAR) 100 MG tablet Take 100 mg by mouth in the morning.   12/20/2021   lovastatin (MEVACOR) 40 MG tablet Take by mouth.   12/19/2021   pantoprazole (PROTONIX) 40 MG tablet Take by mouth.   12/19/2021   acetaminophen (TYLENOL) 650 MG CR tablet Take 650 mg by mouth in the morning and at bedtime.      apixaban (ELIQUIS) 5 MG TABS tablet Take 5 mg by mouth 2 (two) times daily.   12/16/2021   hydrALAZINE (APRESOLINE) 50 MG tablet Take by mouth. (Patient not taking: No sig reported)      ondansetron (ZOFRAN) 4 MG tablet Take 1 tablet (4 mg total) by mouth every 6 (six) hours as needed for nausea. (Patient not taking: No sig reported) 30 tablet 0    oxyCODONE (OXY IR/ROXICODONE) 5 MG immediate release  tablet Take 1-2 tablets (5-10 mg total) by mouth every 4 (four) hours as needed for moderate pain (pain score 4-6). 60 tablet 0    Propylene Glycol (SYSTANE COMPLETE) 0.6 % SOLN Place 1 drop into both eyes daily as needed (dry eyes).      traMADol (ULTRAM) 50 MG tablet Take 1 tablet (50 mg total) by mouth every 6 (six) hours as needed for moderate pain. 30 tablet 0      No Known Allergies   Past Medical History:  Diagnosis Date   Anemia    Arthritis    Atrial fibrillation (HCC)    Constipation    GERD (gastroesophageal reflux disease)    Hemorrhoids    History of pulmonary embolus (PE)    Hyperlipidemia    Hypertension    Vitamin D deficiency     Review of systems:  Otherwise negative.    Physical Exam  Gen: Alert, oriented. Appears stated age.  HEENT: PERRLA. Lungs: No respiratory distress CV: RRR Abd: soft, benign, no masses Ext: No edema    Planned procedures: Proceed with EGD/colonoscopy. The patient understands the nature of the planned procedure, indications, risks, alternatives and potential complications including but not limited to bleeding, infection, perforation, damage to internal organs and possible oversedation/side effects from anesthesia. The patient agrees and gives consent to proceed.  Please refer to procedure notes for findings, recommendations and patient disposition/instructions.     Lesly Rubenstein, MD [facility] Gastroenterology

## 2021-12-20 NOTE — Transfer of Care (Signed)
Immediate Anesthesia Transfer of Care Note  Patient: Laura Zhang  Procedure(s) Performed: COLONOSCOPY WITH PROPOFOL ESOPHAGOGASTRODUODENOSCOPY (EGD) WITH PROPOFOL  Patient Location: Endoscopy Unit  Anesthesia Type:General  Level of Consciousness: awake and alert   Airway & Oxygen Therapy: Patient Spontanous Breathing  Post-op Assessment: Report given to RN and Post -op Vital signs reviewed and stable  Post vital signs: Reviewed and stable  Last Vitals:  Vitals Value Taken Time  BP 106/50 12/20/21 1011  Temp 35.9 C 12/20/21 1011  Pulse 76 12/20/21 1012  Resp 18 12/20/21 1012  SpO2 100 % 12/20/21 1012  Vitals shown include unvalidated device data.  Last Pain:  Vitals:   12/20/21 1011  TempSrc: Temporal         Complications: No notable events documented.

## 2021-12-21 ENCOUNTER — Encounter: Payer: Self-pay | Admitting: Gastroenterology

## 2021-12-21 LAB — SURGICAL PATHOLOGY

## 2021-12-21 NOTE — Anesthesia Postprocedure Evaluation (Signed)
Anesthesia Post Note  Patient: Laura Zhang  Procedure(s) Performed: COLONOSCOPY WITH PROPOFOL ESOPHAGOGASTRODUODENOSCOPY (EGD) WITH PROPOFOL  Patient location during evaluation: Endoscopy Anesthesia Type: General Level of consciousness: awake and alert Pain management: pain level controlled Vital Signs Assessment: post-procedure vital signs reviewed and stable Respiratory status: spontaneous breathing, nonlabored ventilation, respiratory function stable and patient connected to nasal cannula oxygen Cardiovascular status: blood pressure returned to baseline and stable Postop Assessment: no apparent nausea or vomiting Anesthetic complications: no   No notable events documented.   Last Vitals:  Vitals:   12/20/21 1011 12/20/21 1021  BP: (!) 106/50 120/61  Pulse:    Temp: (!) 35.9 C   SpO2:      Last Pain:  Vitals:   12/20/21 1041  TempSrc:   PainSc: 0-No pain                 Dimas Millin

## 2022-03-06 ENCOUNTER — Encounter (INDEPENDENT_AMBULATORY_CARE_PROVIDER_SITE_OTHER): Payer: Self-pay

## 2022-05-18 ENCOUNTER — Other Ambulatory Visit: Payer: Self-pay | Admitting: Family Medicine

## 2022-05-18 DIAGNOSIS — Z1231 Encounter for screening mammogram for malignant neoplasm of breast: Secondary | ICD-10-CM

## 2022-06-09 ENCOUNTER — Other Ambulatory Visit: Payer: Self-pay | Admitting: Cardiovascular Disease

## 2022-07-03 ENCOUNTER — Ambulatory Visit
Admission: RE | Admit: 2022-07-03 | Discharge: 2022-07-03 | Disposition: A | Discharge status: Home / Self Care | Payer: Medicare Other | Source: Ambulatory Visit | Attending: Family Medicine | Admitting: Family Medicine

## 2022-07-03 DIAGNOSIS — Z1231 Encounter for screening mammogram for malignant neoplasm of breast: Secondary | ICD-10-CM | POA: Diagnosis present

## 2022-07-10 ENCOUNTER — Other Ambulatory Visit: Payer: Self-pay | Admitting: Family Medicine

## 2022-07-10 DIAGNOSIS — R928 Other abnormal and inconclusive findings on diagnostic imaging of breast: Secondary | ICD-10-CM

## 2022-07-10 DIAGNOSIS — N63 Unspecified lump in unspecified breast: Secondary | ICD-10-CM

## 2022-07-13 ENCOUNTER — Ambulatory Visit
Admission: RE | Admit: 2022-07-13 | Discharge: 2022-07-13 | Disposition: A | Discharge status: Home / Self Care | Payer: Medicare Other | Source: Ambulatory Visit | Attending: Family Medicine | Admitting: Family Medicine

## 2022-07-13 DIAGNOSIS — R928 Other abnormal and inconclusive findings on diagnostic imaging of breast: Secondary | ICD-10-CM | POA: Diagnosis present

## 2022-07-13 DIAGNOSIS — N63 Unspecified lump in unspecified breast: Secondary | ICD-10-CM | POA: Diagnosis present

## 2022-08-19 ENCOUNTER — Other Ambulatory Visit: Payer: Self-pay | Admitting: Cardiovascular Disease

## 2022-11-21 ENCOUNTER — Other Ambulatory Visit: Payer: Self-pay | Admitting: Cardiovascular Disease

## 2022-12-06 ENCOUNTER — Ambulatory Visit
Admission: EM | Admit: 2022-12-06 | Discharge: 2022-12-06 | Disposition: A | Discharge status: Home / Self Care | Payer: Medicare Other | Attending: Family Medicine | Admitting: Family Medicine

## 2022-12-06 DIAGNOSIS — U071 COVID-19: Secondary | ICD-10-CM | POA: Diagnosis present

## 2022-12-06 LAB — SARS CORONAVIRUS 2 BY RT PCR: SARS Coronavirus 2 by RT PCR: POSITIVE — AB

## 2022-12-06 MED ORDER — BENZONATATE 100 MG PO CAPS: 100.0000 mg | ORAL_CAPSULE | Freq: Three times a day (TID) | ORAL | 0 refills | Status: AC

## 2022-12-06 NOTE — ED Provider Notes (Signed)
[facility]    CSN: [id number] Arrival date & time: 12/06/22  0846      History   Chief Complaint Chief Complaint  Patient presents with   Cough    HPI Laura Zhang is a 77 y.o. female.   HPI  History obtained from the patient. Laura Zhang presents for COVID test.  Has intermittent productive cough,scratchy throat and slight nasal congestion that started this week. Taking Tylenol and Coricidin.    No history of smoking or asthma.       Past Medical History:  Diagnosis Date   Anemia    Arthritis    Atrial fibrillation (HCC)    Constipation    GERD (gastroesophageal reflux disease)    Hemorrhoids    History of pulmonary embolus (PE)    Hyperlipidemia    Hypertension    Vitamin D deficiency     Patient Active Problem List   Diagnosis Date Noted   Status post total knee replacement using cement, right 11/02/2020   Hypertension 06/29/2017   Arthritis 06/29/2017   History of pulmonary embolus (PE) 06/29/2017   Obesity (BMI 30-39.9) 06/05/2017   Postoperative atrial fibrillation (HCC) 04/30/2016   Left hip pain 10/12/2014   Primary osteoarthritis of left knee 10/12/2014   GERD (gastroesophageal reflux disease) 02/25/2014   Hyperlipidemia, unspecified 02/25/2014   Impaired glucose tolerance 02/25/2014    Past Surgical History:  Procedure Laterality Date   ABDOMINAL HYSTERECTOMY     CARDIAC CATHETERIZATION     1999 per patient   CARPAL TUNNEL RELEASE Left    CATARACT EXTRACTION, BILATERAL     COLONOSCOPY     COLONOSCOPY WITH PROPOFOL N/A 06/05/2018   Procedure: COLONOSCOPY WITH PROPOFOL;  Surgeon: Scot Jun, MD;  Location: [facility] ENDOSCOPY;  Service: Endoscopy;  Laterality: N/A;   COLONOSCOPY WITH PROPOFOL N/A 12/20/2021   Procedure: COLONOSCOPY WITH PROPOFOL;  Surgeon: Regis Bill, MD;  Location: [facility] ENDOSCOPY;  Service: Endoscopy;  Laterality: N/A;   ESOPHAGOGASTRODUODENOSCOPY (EGD) WITH PROPOFOL N/A 03/24/2015   Procedure:  ESOPHAGOGASTRODUODENOSCOPY (EGD) WITH PROPOFOL;  Surgeon: Scot Jun, MD;  Location: [facility] ENDOSCOPY;  Service: Endoscopy;  Laterality: N/A;   ESOPHAGOGASTRODUODENOSCOPY (EGD) WITH PROPOFOL N/A 07/24/2019   Procedure: ESOPHAGOGASTRODUODENOSCOPY (EGD) WITH PROPOFOL;  Surgeon: Toledo, Boykin Nearing, MD;  Location: [facility] ENDOSCOPY;  Service: Gastroenterology;  Laterality: N/A;   ESOPHAGOGASTRODUODENOSCOPY (EGD) WITH PROPOFOL N/A 12/20/2021   Procedure: ESOPHAGOGASTRODUODENOSCOPY (EGD) WITH PROPOFOL;  Surgeon: Regis Bill, MD;  Location: [facility] ENDOSCOPY;  Service: Endoscopy;  Laterality: N/A;   great toe fusion Left    JOINT REPLACEMENT     Left knee   KNEE CLOSED REDUCTION Left 05/31/2016   Procedure: CLOSED MANIPULATION KNEE;  Surgeon: Erin Sons, MD;  Location: [facility] ORS;  Service: Orthopedics;  Laterality: Left;   TOTAL KNEE ARTHROPLASTY Right 11/02/2020   Procedure: TOTAL KNEE ARTHROPLASTY;  Surgeon: Christena Flake, MD;  Location: [facility] ORS;  Service: Orthopedics;  Laterality: Right;    OB History   No obstetric history on file.      Home Medications    Prior to Admission medications   Medication Sig Start Date End Date Taking? Authorizing Provider  benzonatate (TESSALON) 100 MG capsule Take 1 capsule (100 mg total) by mouth every 8 (eight) hours. 12/06/22  Yes Tonianne Fine, Seward Meth, DO  acetaminophen (TYLENOL) 650 MG CR tablet Take 650 mg by mouth in the morning and at bedtime.    [provider]  apixaban (ELIQUIS) 5 MG TABS tablet Take 5  mg by mouth 2 (two) times daily.    [provider]  chlorthalidone (HYGROTON) 25 MG tablet TAKE 1 TABLET BY MOUTH EVERY DAY 11/21/22   Laurier Nancy, MD  Cholecalciferol (VITAMIN D) 50 MCG (2000 UT) tablet Take 2,000 Units by mouth in the morning.    [provider]  Cholecalciferol 25 MCG (1000 UT) capsule Take by mouth.    [provider]  hydrALAZINE (APRESOLINE) 100 MG tablet TAKE 1 TABLET BY MOUTH TWICE  DAILY 08/21/22   Adrian Blackwater A, MD  labetalol (NORMODYNE) 200 MG tablet TAKE 1 TABLET BY MOUTH TWICE DAILY 06/09/22   Laurier Nancy, MD  losartan (COZAAR) 100 MG tablet Take 100 mg by mouth in the morning.    [provider]  lovastatin (MEVACOR) 40 MG tablet Take by mouth. 08/13/18   [provider]  ondansetron (ZOFRAN) 4 MG tablet Take 1 tablet (4 mg total) by mouth every 6 (six) hours as needed for nausea. Patient not taking: No sig reported 11/05/20   Anson Oregon, PA-C  oxyCODONE (OXY IR/ROXICODONE) 5 MG immediate release tablet Take 1-2 tablets (5-10 mg total) by mouth every 4 (four) hours as needed for moderate pain (pain score 4-6). 11/05/20   Anson Oregon, PA-C  pantoprazole (PROTONIX) 40 MG tablet Take by mouth. 03/18/20 12/20/21  [provider]  Propylene Glycol (SYSTANE COMPLETE) 0.6 % SOLN Place 1 drop into both eyes daily as needed (dry eyes).    [provider]  traMADol (ULTRAM) 50 MG tablet Take 1 tablet (50 mg total) by mouth every 6 (six) hours as needed for moderate pain. 11/05/20   Anson Oregon, PA-C    Family History Family History  Problem Relation Age of Onset   Rheum arthritis Mother    Diabetes Mother    Hypertension Father    Alcohol abuse Father    Heart attack Father    Hypertension Sister    Diabetes Sister    Hypothyroidism Sister    Breast cancer Neg Hx     Social History Social History   Tobacco Use   Smoking status: Never   Smokeless tobacco: Never  Vaping Use   Vaping status: Never Used  Substance Use Topics   Alcohol use: No   Drug use: No     Allergies   Patient has no known allergies.   Review of Systems Review of Systems: negative unless otherwise stated in HPI.      Physical Exam Triage Vital Signs ED Triage Vitals [12/06/22 0904]  Encounter Vitals Group     BP (!) 166/78     Systolic BP Percentile      Diastolic BP Percentile      Pulse Rate 64     Resp 16     Temp  97.8 F (36.6 C)     Temp Source Oral     SpO2 100 %     Weight      Height      Head Circumference      Peak Flow      Pain Score 0     Pain Loc      Pain Education      Exclude from Growth Chart    No data found.  Updated Vital Signs BP (!) 157/74 (BP Location: Left Arm)   Pulse 64   Temp 97.8 F (36.6 C) (Oral)   Resp 16   SpO2 100%   Visual Acuity Right Eye Distance:  Left Eye Distance:   Bilateral Distance:    Right Eye Near:   Left Eye Near:    Bilateral Near:     Physical Exam GEN:     alert, well appearing elderly female in no distress    HENT:  mucus membranes moist, no nasal discharge EYES:  no scleral injection or discharge RESP:  no increased work of breathing, clear to auscultation bilaterally CVS:   regular rate and rhythm Skin:   warm and dry    UC Treatments / Results  Labs (all labs ordered are listed, but only abnormal results are displayed) Labs Reviewed  SARS CORONAVIRUS 2 BY RT PCR - Abnormal; Notable for the following components:      Result Value   SARS Coronavirus 2 by RT PCR POSITIVE (*)    All other components within normal limits    EKG   Radiology No results found.  Procedures Procedures (including critical care time)  Medications Ordered in UC Medications - No data to display  Initial Impression / Assessment and Plan / UC Course  I have reviewed the triage vital signs and the nursing notes.  Pertinent labs & imaging results that were available during my care of the patient were reviewed by me and considered in my medical decision making (see chart for details).       Pt is a 77 y.o. female who presents for respiratory symptoms. Byanca is afebrile here without recent antipyretics. Satting well on room air. Overall pt is well appearing, well hydrated, without respiratory distress. Pulmonary exam is unremarkable.  COVID testing obtained. Pt to quarantine until COVID test results or longer if positive.  I will call  patient with test results, if positive. History consistent with viral respiratory illness. Discussed symptomatic treatment.  Explained lack of efficacy of antibiotics in viral disease. Tessalon perles Rx for cough.  Typical duration of symptoms discussed.   Return and ED precautions given and voiced understanding. Discussed MDM, treatment plan and plan for follow-up with patient who agrees with plan.   Pt's COVID test is positive.  She was called and updated on results.  All questions asked were answered.     Final Clinical Impressions(s) / UC Diagnoses   Final diagnoses:  COVID-19     Discharge Instructions      Your test for COVID-19 was positive, meaning that you were infected with the novel coronavirus and could give the germ to others.  The recommendations suggest returning to normal activities when, for at least 24 hours, symptoms are improving overall, and if a fever was present, it has been gone without use of a fever-reducing medication.  You should wear a mask for the next 5 days to prevent the spread of disease. Please continue good preventive care measures, including:  frequent hand-washing, avoid touching your face, cover coughs/sneezes, stay out of crowds and keep a 6 foot distance from others.  Go to the nearest hospital emergency room if fever/cough/breathlessness are severe or illness seems like a threat to life.  If your were prescribed medication. Stop by the pharmacy to pick it up. You can take Tylenol and/or Ibuprofen as needed for fever reduction and pain relief.    For cough: honey 1/2 to 1 teaspoon (you can dilute the honey in water or another fluid).  You can also use guaifenesin and dextromethorphan for cough. You can use a humidifier for chest congestion and cough.  If you don't have a humidifier, you can sit in the bathroom with  the hot shower running.      It is important to stay hydrated: drink plenty of fluids (water, gatorade/powerade/pedialyte, juices, or  teas) to keep your throat moisturized and help further relieve irritation/discomfort.    Return or go to the Emergency Department if symptoms worsen or do not improve in the next few days      ED Prescriptions     Medication Sig Dispense Auth. Provider   benzonatate (TESSALON) 100 MG capsule Take 1 capsule (100 mg total) by mouth every 8 (eight) hours. 21 capsule Katha Cabal, DO      PDMP not reviewed this encounter.   Katha Cabal, DO 12/09/22 [id number]

## 2022-12-06 NOTE — Discharge Instructions (Addendum)
Your test for COVID-19 was positive, meaning that you were infected with the novel coronavirus and could give the germ to others.  The recommendations suggest returning to normal activities when, for at least 24 hours, symptoms are improving overall, and if a fever was present, it has been gone without use of a fever-reducing medication.  You should wear a mask for the next 5 days to prevent the spread of disease. Please continue good preventive care measures, including:  frequent hand-washing, avoid touching your face, cover coughs/sneezes, stay out of crowds and keep a 6 foot distance from others.  Go to the nearest hospital emergency room if fever/cough/breathlessness are severe or illness seems like a threat to life.  If your were prescribed medication. Stop by the pharmacy to pick it up. You can take Tylenol and/or Ibuprofen as needed for fever reduction and pain relief.    For cough: honey 1/2 to 1 teaspoon (you can dilute the honey in water or another fluid).  You can also use guaifenesin and dextromethorphan for cough. You can use a humidifier for chest congestion and cough.  If you don't have a humidifier, you can sit in the bathroom with the hot shower running.      It is important to stay hydrated: drink plenty of fluids (water, gatorade/powerade/pedialyte, juices, or teas) to keep your throat moisturized and help further relieve irritation/discomfort.    Return or go to the Emergency Department if symptoms worsen or do not improve in the next few days

## 2022-12-06 NOTE — ED Triage Notes (Signed)
Patient presents to UC for cough x 7 days. States she has a cardiology appt today and wants to make sure she does not have COVID. Treating cough with coricidin.   Denies fever or SOB.

## 2023-04-21 IMAGING — MG MM DIGITAL SCREENING BILAT W/ TOMO AND CAD
8 series · 8 of 24 positions shown · non-contrast
Comparison: Previous exam(s).

CLINICAL DATA: Screening.

EXAM:
DIGITAL SCREENING BILATERAL MAMMOGRAM WITH TOMOSYNTHESIS AND CAD
TECHNIQUE: Bilateral screening digital craniocaudal and mediolateral oblique
mammograms were obtained. Bilateral screening digital breast
tomosynthesis was performed. The images were evaluated with
computer-aided detection.

[R MLO synth-2D]
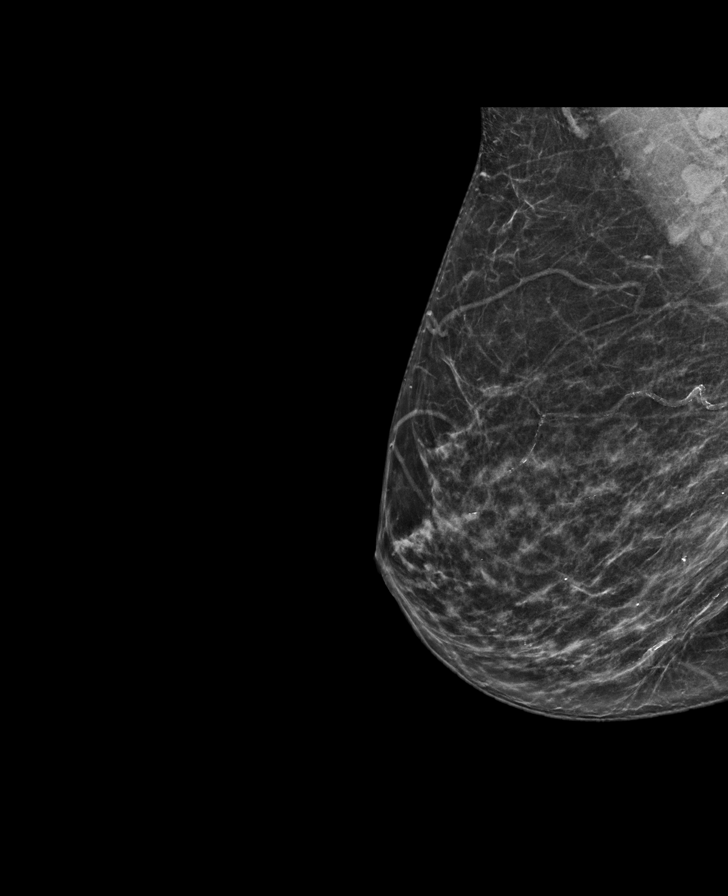

[L CC synth-2D]
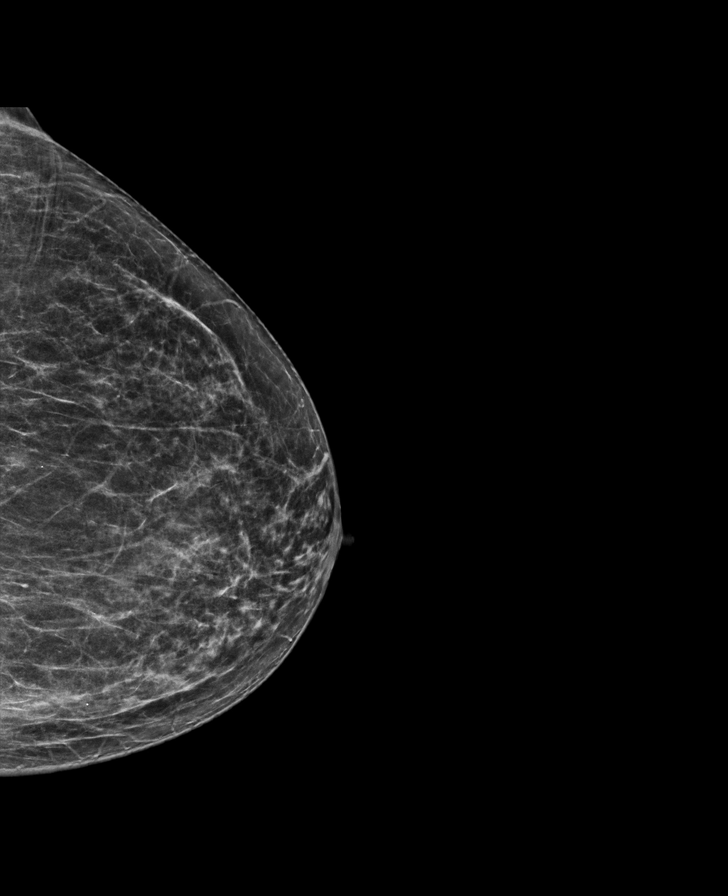

[L MLO synth-2D]
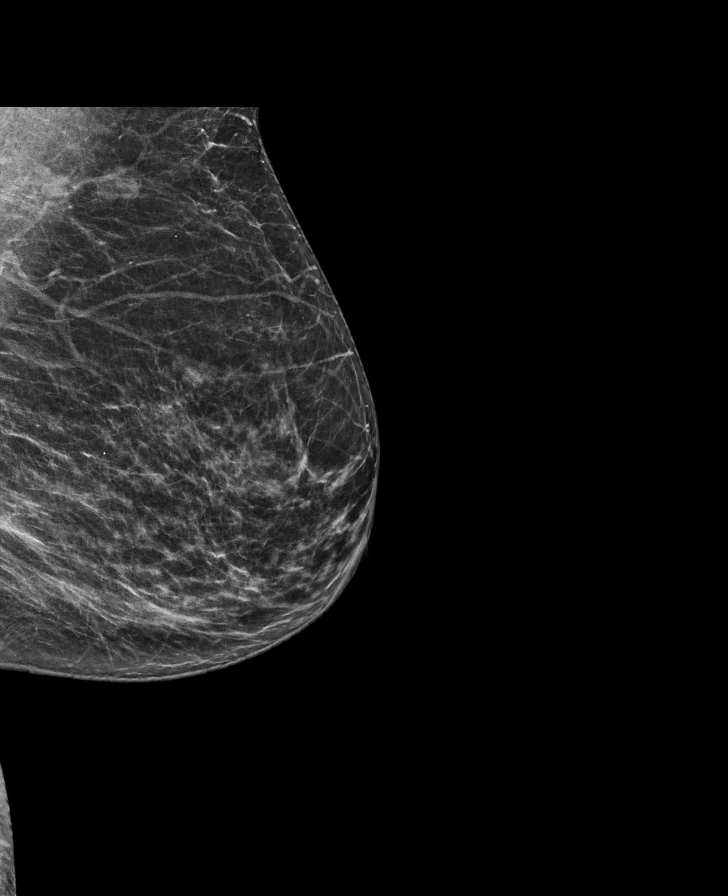

[R CC synth-2D]
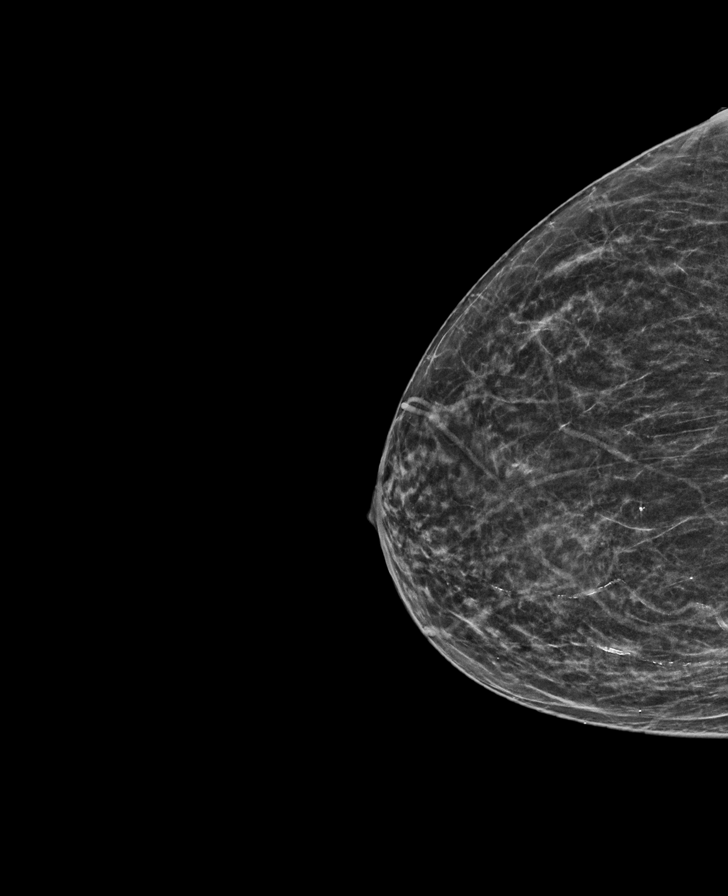

[L CC tomo · tomo slice 31/60.0]
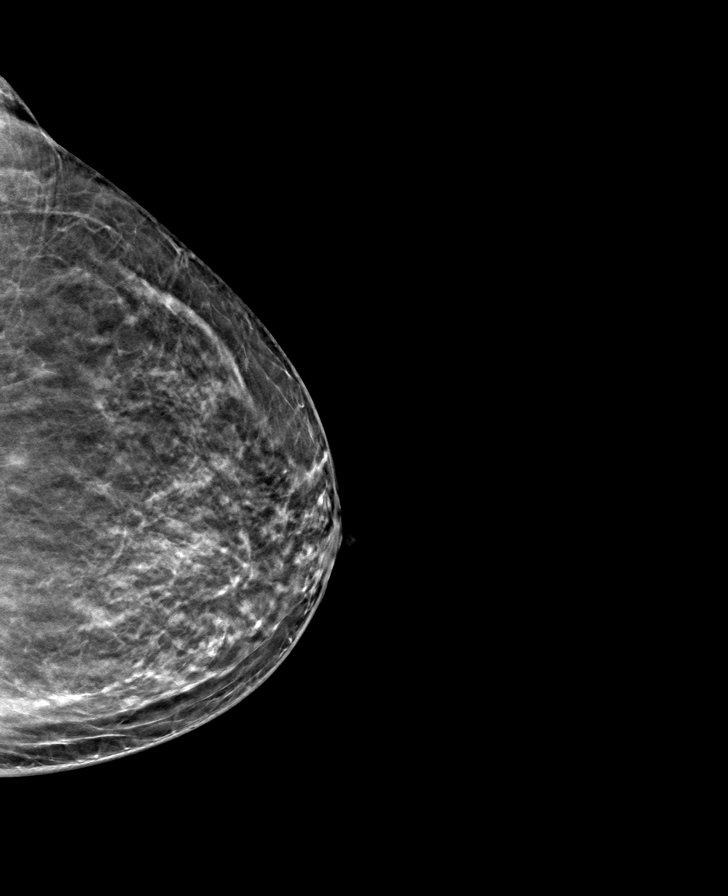

[R MLO tomo · tomo slice 31/61.0]
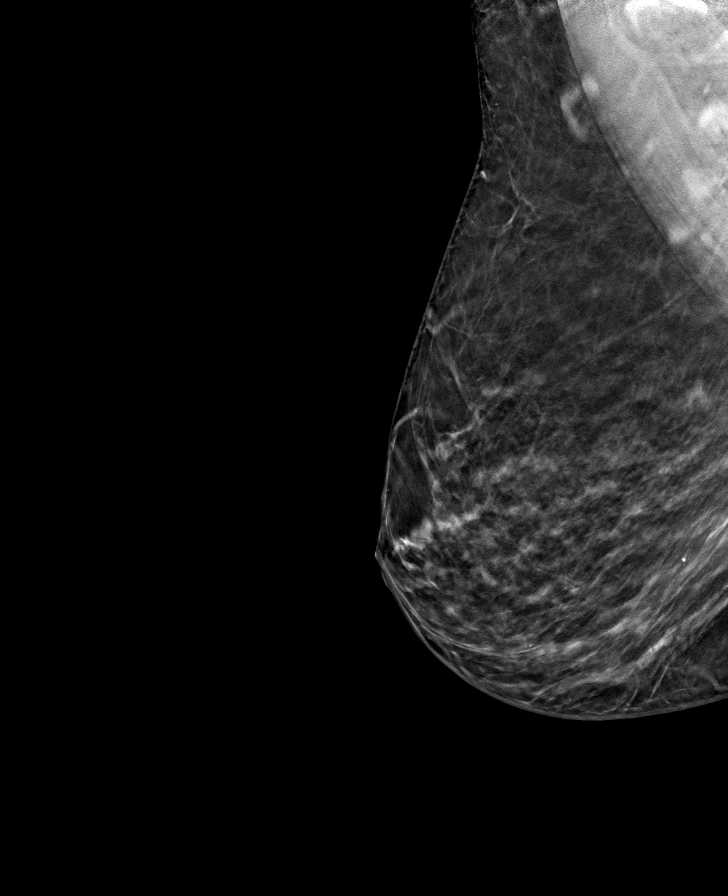

[R CC tomo · tomo slice 27/54.0]
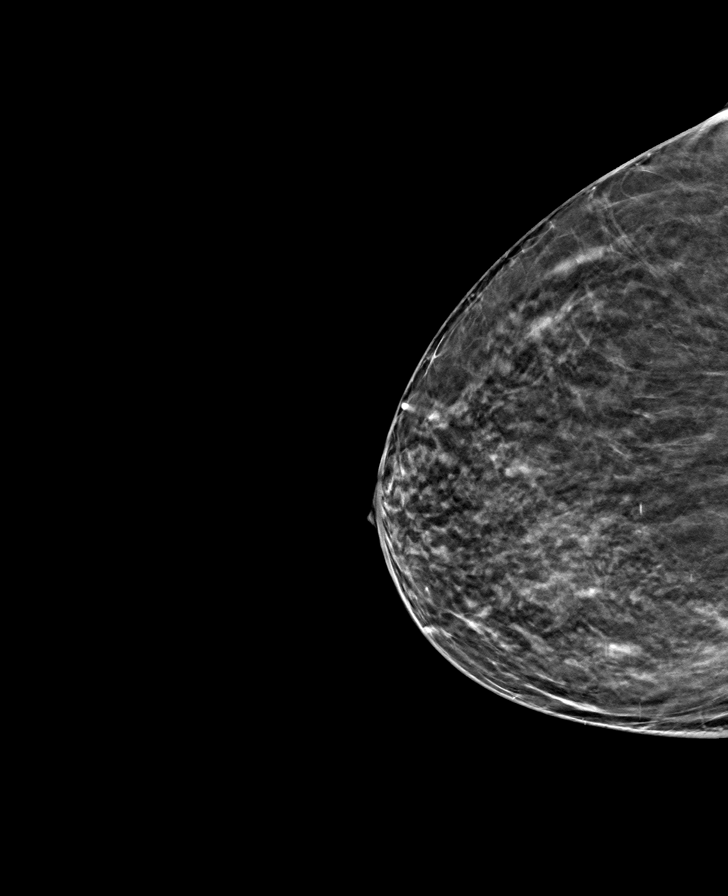

[L MLO tomo · tomo slice 32/63.0]
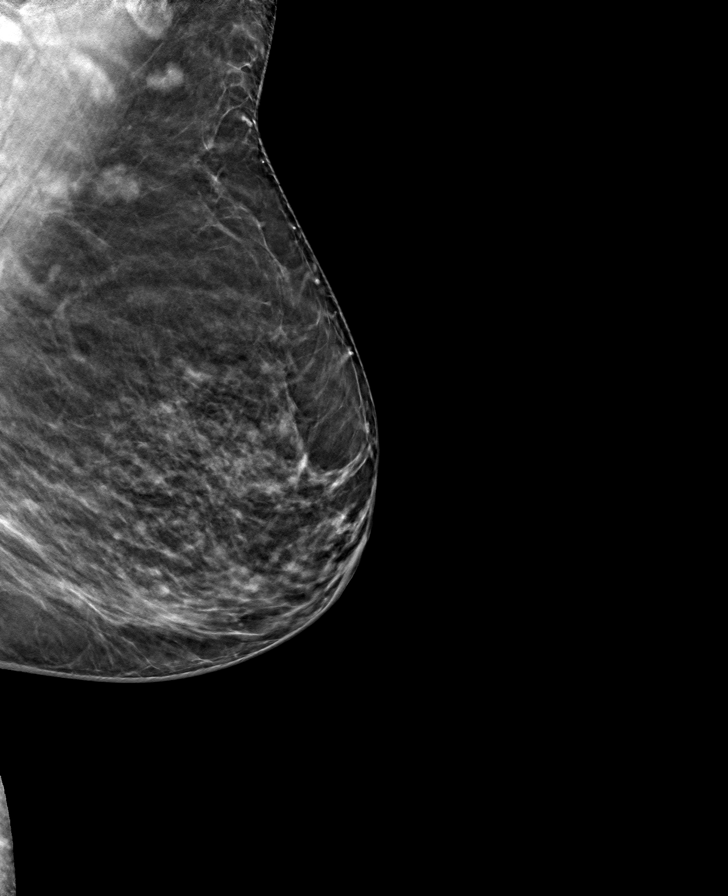

[8 of 24 positions shown; findings below may reference images not displayed]

ACR Breast Density Category b: There are scattered areas of
fibroglandular density.
FINDINGS: There are no findings suspicious for malignancy.
IMPRESSION: No mammographic evidence of malignancy. A result letter of this
screening mammogram will be mailed directly to the patient.

RECOMMENDATION:
Screening mammogram in one year. (Code:51-O-LD2)

BI-RADS CATEGORY  1: Negative.

## 2023-06-15 ENCOUNTER — Ambulatory Visit: Payer: Medicare Other | Admitting: Certified Registered"

## 2023-06-15 ENCOUNTER — Encounter: Payer: Self-pay | Admitting: *Deleted

## 2023-06-15 ENCOUNTER — Encounter
Admission: RE | Disposition: A | Discharge status: Home / Self Care | Payer: Self-pay | Source: Home / Self Care | Attending: Gastroenterology

## 2023-06-15 ENCOUNTER — Ambulatory Visit
Admission: RE | Admit: 2023-06-15 | Discharge: 2023-06-15 | Disposition: A | Discharge status: Home / Self Care | Payer: Medicare Other | Attending: Gastroenterology | Admitting: Gastroenterology

## 2023-06-15 DIAGNOSIS — K5730 Diverticulosis of large intestine without perforation or abscess without bleeding: Secondary | ICD-10-CM | POA: Insufficient documentation

## 2023-06-15 DIAGNOSIS — K641 Second degree hemorrhoids: Secondary | ICD-10-CM | POA: Insufficient documentation

## 2023-06-15 DIAGNOSIS — Z86711 Personal history of pulmonary embolism: Secondary | ICD-10-CM | POA: Insufficient documentation

## 2023-06-15 DIAGNOSIS — Z7901 Long term (current) use of anticoagulants: Secondary | ICD-10-CM | POA: Diagnosis not present

## 2023-06-15 DIAGNOSIS — Z1211 Encounter for screening for malignant neoplasm of colon: Secondary | ICD-10-CM | POA: Insufficient documentation

## 2023-06-15 DIAGNOSIS — K635 Polyp of colon: Secondary | ICD-10-CM | POA: Insufficient documentation

## 2023-06-15 DIAGNOSIS — I4891 Unspecified atrial fibrillation: Secondary | ICD-10-CM | POA: Insufficient documentation

## 2023-06-15 DIAGNOSIS — I10 Essential (primary) hypertension: Secondary | ICD-10-CM | POA: Diagnosis not present

## 2023-06-15 DIAGNOSIS — K219 Gastro-esophageal reflux disease without esophagitis: Secondary | ICD-10-CM | POA: Diagnosis not present

## 2023-06-15 HISTORY — PX: POLYPECTOMY: SHX5525

## 2023-06-15 HISTORY — PX: COLONOSCOPY WITH PROPOFOL: SHX5780

## 2023-06-15 SURGERY — COLONOSCOPY WITH PROPOFOL
Anesthesia: General

## 2023-06-15 MED ORDER — LIDOCAINE HCL (PF) 1 % IJ SOLN
INTRAMUSCULAR | Status: DC | PRN
  Administered 2023-06-15: 4 mg

## 2023-06-15 MED ORDER — PROPOFOL 10 MG/ML IV BOLUS
INTRAVENOUS | Status: DC | PRN
  Administered 2023-06-15: 40 mg via INTRAVENOUS
  Administered 2023-06-15: 30 mg via INTRAVENOUS
  Administered 2023-06-15 (×7): 20 mg via INTRAVENOUS

## 2023-06-15 MED ORDER — SODIUM CHLORIDE 0.9 % IV SOLN: INTRAVENOUS | Status: DC

## 2023-06-15 NOTE — H&P (Signed)
 Outpatient short stay form Pre-procedure 06/15/2023  Laura ONEIDA Schick, MD  Primary Physician: Jeffie Cheryl BRAVO, MD  Reason for visit:  Surveillance  History of present illness:    78 y/o lady with history of hypertension, GERD, and a. Fib (not on DOAC) here for surveillance colonoscopy. No family history of GI malignancies. History of hysterectomy. Last colonoscopy in 2023 with small Ta's but fair prep.   No current facility-administered medications for this encounter.  Medications Prior to Admission  Medication Sig Dispense Refill Last Dose/Taking   acetaminophen  (TYLENOL ) 650 MG CR tablet Take 650 mg by mouth in the morning and at bedtime.   Past Week   benzonatate  (TESSALON ) 100 MG capsule Take 1 capsule (100 mg total) by mouth every 8 (eight) hours. 21 capsule 0 Past Week   chlorthalidone  (HYGROTON ) 25 MG tablet TAKE 1 TABLET BY MOUTH EVERY DAY 90 tablet 3 06/14/2023 Morning   Cholecalciferol  (VITAMIN D ) 50 MCG (2000 UT) tablet Take 2,000 Units by mouth in the morning.   Past Week   Cholecalciferol  25 MCG (1000 UT) capsule Take by mouth.   Past Week   hydrALAZINE  (APRESOLINE ) 100 MG tablet TAKE 1 TABLET BY MOUTH TWICE DAILY 180 tablet 0 06/14/2023 Morning   labetalol  (NORMODYNE ) 200 MG tablet TAKE 1 TABLET BY MOUTH TWICE DAILY 180 tablet 1 06/14/2023 Morning   losartan  (COZAAR ) 100 MG tablet Take 100 mg by mouth in the morning.   06/14/2023 Morning   lovastatin (MEVACOR) 40 MG tablet Take by mouth.   06/14/2023 Bedtime   Propylene Glycol (SYSTANE COMPLETE) 0.6 % SOLN Place 1 drop into both eyes daily as needed (dry eyes).   06/14/2023   traMADol  (ULTRAM ) 50 MG tablet Take 1 tablet (50 mg total) by mouth every 6 (six) hours as needed for moderate pain. 30 tablet 0 Past Week   apixaban  (ELIQUIS ) 5 MG TABS tablet Take 5 mg by mouth 2 (two) times daily. (Patient not taking: Reported on 06/15/2023)   Completed Course   ondansetron  (ZOFRAN ) 4 MG tablet Take 1 tablet (4 mg total) by mouth every 6 (six)  hours as needed for nausea. (Patient not taking: No sig reported) 30 tablet 0    oxyCODONE  (OXY IR/ROXICODONE ) 5 MG immediate release tablet Take 1-2 tablets (5-10 mg total) by mouth every 4 (four) hours as needed for moderate pain (pain score 4-6). 60 tablet 0    pantoprazole  (PROTONIX ) 40 MG tablet Take by mouth.        No Known Allergies   Past Medical History:  Diagnosis Date   Anemia    Arthritis    Atrial fibrillation (HCC)    Constipation    GERD (gastroesophageal reflux disease)    Hemorrhoids    History of pulmonary embolus (PE)    Hyperlipidemia    Hypertension    Vitamin D  deficiency     Review of systems:  Otherwise negative.    Physical Exam  Gen: Alert, oriented. Appears stated age.  HEENT: PERRLA. Lungs:No respiratory distress CV: RRR Abd: soft, benign, no masses Ext: No edema    Planned procedures: Proceed with colonoscopy. The patient understands the nature of the planned procedure, indications, risks, alternatives and potential complications including but not limited to bleeding, infection, perforation, damage to internal organs and possible oversedation/side effects from anesthesia. The patient agrees and gives consent to proceed.  Please refer to procedure notes for findings, recommendations and patient disposition/instructions.     Laura ONEIDA Schick, MD [facility] Gastroenterology

## 2023-06-15 NOTE — Op Note (Signed)
 [facility]astroenterology Patient Name: Laura Zhang Procedure Date: 06/15/2023 10:38 AM MRN: [id number] Account #: [id number] Date of Birth: February 01, 1946 Admit Type: Outpatient Age: 79 Room: [facility] ENDO ROOM 3 Gender: Female Note Status: Finalized Instrument Name: Arvis [id number] Procedure:             Colonoscopy Indications:           Surveillance: History of adenomatous polyps,                         inadequate prep on last exam (<30yr) Providers:             Ole Schick MD, MD Referring MD:          Cheryl CHARLENA Jericho (Referring MD) Medicines:             Monitored Anesthesia Care Complications:         No immediate complications. Estimated blood loss:                         Minimal. Procedure:             Pre-Anesthesia Assessment:                        - Prior to the procedure, a History and Physical was                         performed, and patient medications and allergies were                         reviewed. The patient is competent. The risks and                         benefits of the procedure and the sedation options and                         risks were discussed with the patient. All questions                         were answered and informed consent was obtained.                         Patient identification and proposed procedure were                         verified by the physician, the nurse, the                         anesthesiologist, the anesthetist and the technician                         in the endoscopy suite. Mental Status Examination:                         alert and oriented. Airway Examination: normal                         oropharyngeal airway and neck mobility. Respiratory  Examination: clear to auscultation. CV Examination:                         normal. Prophylactic Antibiotics: The patient does not                         require prophylactic antibiotics. Prior                          Anticoagulants: The patient has taken no anticoagulant                         or antiplatelet agents. ASA Grade Assessment: III - A                         patient with severe systemic disease. After reviewing                         the risks and benefits, the patient was deemed in                         satisfactory condition to undergo the procedure. The                         anesthesia plan was to use monitored anesthesia care                         (MAC). Immediately prior to administration of                         medications, the patient was re-assessed for adequacy                         to receive sedatives. The heart rate, respiratory                         rate, oxygen saturations, blood pressure, adequacy of                         pulmonary ventilation, and response to care were                         monitored throughout the procedure. The physical                         status of the patient was re-assessed after the                         procedure.                        After obtaining informed consent, the colonoscope was                         passed under direct vision. Throughout the procedure,                         the patient's blood pressure, pulse, and oxygen  saturations were monitored continuously. The                         Colonoscope was introduced through the anus and                         advanced to the the cecum, identified by appendiceal                         orifice and ileocecal valve. The colonoscopy was                         performed without difficulty. The patient tolerated                         the procedure well. The quality of the bowel                         preparation was good. The ileocecal valve, appendiceal                         orifice, and rectum were photographed. Findings:      The perianal and digital rectal examinations were normal.      A 1 mm polyp was found in the ascending  colon. The polyp was sessile.       The polyp was removed with a jumbo cold forceps. Resection and retrieval       were complete. Estimated blood loss was minimal.      Multiple large-mouthed and small-mouthed diverticula were found in the       sigmoid colon.      Internal hemorrhoids were found during retroflexion. The hemorrhoids       were Grade II (internal hemorrhoids that prolapse but reduce       spontaneously).      The exam was otherwise without abnormality on direct and retroflexion       views. Impression:            - One 1 mm polyp in the ascending colon, removed with                         a jumbo cold forceps. Resected and retrieved.                        - Diverticulosis in the sigmoid colon.                        - Internal hemorrhoids.                        - The examination was otherwise normal on direct and                         retroflexion views. Recommendation:        - Discharge patient to home.                        - Resume previous diet.                        - Continue present medications.                        -  Await pathology results.                        - Repeat colonoscopy is not recommended due to current                         age (48 years or older) for surveillance.                        - Return to referring physician as previously                         scheduled. Procedure Code(s):     --- Professional ---                        [id number], Colonoscopy, flexible; with biopsy, single or                         multiple Diagnosis Code(s):     --- Professional ---                        Z86.010, Personal history of colonic polyps                        D12.2, Benign neoplasm of ascending colon                        K64.1, Second degree hemorrhoids                        K57.30, Diverticulosis of large intestine without                         perforation or abscess without bleeding CPT copyright 2022 [employer]. All  rights reserved. The codes documented in this report are preliminary and upon coder review may  be revised to meet current compliance requirements. Ole Schick MD, MD 06/15/2023 11:03:09 AM Number of Addenda: 0 Note Initiated On: 06/15/2023 10:38 AM Scope Withdrawal Time: 0 hours 7 minutes 37 seconds  Total Procedure Duration: 0 hours 13 minutes 25 seconds  Estimated Blood Loss:  Estimated blood loss was minimal.      [facility]

## 2023-06-15 NOTE — Transfer of Care (Signed)
 Immediate Anesthesia Transfer of Care Note  Patient: Laura Zhang  Procedure(s) Performed: COLONOSCOPY WITH PROPOFOL  POLYPECTOMY  Patient Location: PACU and Endoscopy Unit  Anesthesia Type:MAC  Level of Consciousness: awake and alert   Airway & Oxygen Therapy: Patient Spontanous Breathing and Patient connected to nasal cannula oxygen  Post-op Assessment: Report given to RN and Post -op Vital signs reviewed and stable  Post vital signs: Reviewed and stable  Last Vitals:  Vitals Value Taken Time  BP 101/53   Temp    Pulse 67   Resp 16   SpO2 96     Last Pain:  Vitals:   06/15/23 1017  TempSrc: Temporal  PainSc: 0-No pain         Complications: No notable events documented.

## 2023-06-15 NOTE — Anesthesia Postprocedure Evaluation (Signed)
 Anesthesia Post Note  Patient: Laura Zhang  Procedure(s) Performed: COLONOSCOPY WITH PROPOFOL  POLYPECTOMY  Patient location during evaluation: Endoscopy Anesthesia Type: General Level of consciousness: awake and alert Pain management: pain level controlled Vital Signs Assessment: post-procedure vital signs reviewed and stable Respiratory status: spontaneous breathing, nonlabored ventilation, respiratory function stable and patient connected to nasal cannula oxygen Cardiovascular status: blood pressure returned to baseline and stable Postop Assessment: no apparent nausea or vomiting Anesthetic complications: no  No notable events documented.   Last Vitals:  Vitals:   06/15/23 1110 06/15/23 1115  BP: 111/60 (!) 123/57  Pulse: 68 61  Resp: 17 (!) 24  Temp:  (!) 36.3 C  SpO2: 100% 100%    Last Pain:  Vitals:   06/15/23 1103  TempSrc: Temporal  PainSc: 0-No pain                 Debby Mines

## 2023-06-15 NOTE — Interval H&P Note (Signed)
 History and Physical Interval Note:  06/15/2023 10:27 AM  Gailen KANDICE Bull  has presented today for surgery, with the diagnosis of HX OF ADENOMATOUS POLYP OF COLON.  The various methods of treatment have been discussed with the patient and family. After consideration of risks, benefits and other options for treatment, the patient has consented to  Procedure(s): COLONOSCOPY WITH PROPOFOL  (N/A) as a surgical intervention.  The patient's history has been reviewed, patient examined, no change in status, stable for surgery.  I have reviewed the patient's chart and labs.  Questions were answered to the patient's satisfaction.     Ole ONEIDA Schick  Ok to proceed with colonoscopy

## 2023-06-15 NOTE — Anesthesia Preprocedure Evaluation (Signed)
 Anesthesia Evaluation  Patient identified by MRN, date of birth, ID band Patient awake    Reviewed: Allergy & Precautions, NPO status , Patient's Chart, lab work & pertinent test results  History of Anesthesia Complications Negative for: history of anesthetic complications  Airway Mallampati: II  TM Distance: >3 FB Neck ROM: full    Dental  (+) Missing, Upper Dentures, Dental Advidsory Given   Pulmonary neg pulmonary ROS, neg shortness of breath, neg recent URI   Pulmonary exam normal        Cardiovascular hypertension, (-) Past MI and (-) CABG + dysrhythmias Atrial Fibrillation (-) pacemaker     Neuro/Psych negative neurological ROS  negative psych ROS   GI/Hepatic negative GI ROS, Neg liver ROS,,,  Endo/Other  negative endocrine ROS    Renal/GU      Musculoskeletal   Abdominal   Peds  Hematology negative hematology ROS (+)   Anesthesia Other Findings Past Medical History: No date: Anemia No date: Arthritis No date: Atrial fibrillation (HCC) No date: Constipation No date: GERD (gastroesophageal reflux disease) No date: Hemorrhoids No date: History of pulmonary embolus (PE) No date: Hyperlipidemia No date: Hypertension No date: Vitamin D  deficiency  Past Surgical History: No date: ABDOMINAL HYSTERECTOMY No date: CARDIAC CATHETERIZATION     Comment:  1999 per patient No date: CARPAL TUNNEL RELEASE; Left No date: CATARACT EXTRACTION, BILATERAL No date: COLONOSCOPY 06/05/2018: COLONOSCOPY WITH PROPOFOL ; N/A     Comment:  Procedure: COLONOSCOPY WITH PROPOFOL ;  Surgeon: Viktoria Lamar DASEN, MD;  Location: [facility] END[facility];  Service:               Endoscopy;  Laterality: N/A; 03/24/2015: ESOPHAGOGASTRODUODENOSCOPY (EGD) WITH PROPOFOL ; N/A     Comment:  Procedure: ESOPHAGOGASTRODUODENOSCOPY (EGD) WITH               PROPOFOL ;  Surgeon: Lamar DASEN Viktoria, MD;  [facility]              ENDOSCOPY;   Service: Endoscopy;  Laterality: N/A; 07/24/2019: ESOPHAGOGASTRODUODENOSCOPY (EGD) WITH PROPOFOL ; N/A     Comment:  Procedure: ESOPHAGOGASTRODUODENOSCOPY (EGD) WITH               PROPOFOL ;  Surgeon: Toledo, Ladell POUR, MD;  Location:               [facility] ENDOSCOPY;  Service: Gastroenterology;  Laterality:               N/A; No date: great toe fusion; Left No date: JOINT REPLACEMENT     Comment:  Left knee 05/31/2016: KNEE CLOSED REDUCTION; Left     Comment:  Procedure: CLOSED MANIPULATION KNEE;  Surgeon: Helayne Glenn, MD;  Location: [facility] ORS;  Service: Orthopedics;              Laterality: Left; 11/02/2020: TOTAL KNEE ARTHROPLASTY; Right     Comment:  Procedure: TOTAL KNEE ARTHROPLASTY;  Surgeon: Edie Norleen PARAS, MD;  Location: [facility] ORS;  Service: Orthopedics;                Laterality: Right;  BMI    Body Mass Index: 30.90 kg/m      Reproductive/Obstetrics negative OB ROS  Anesthesia Physical Anesthesia Plan  ASA: 3  Anesthesia Plan: General   Post-op Pain Management: Minimal or no pain anticipated   Induction: Intravenous  PONV Risk Score and Plan: 3 and Propofol  infusion, TIVA and Ondansetron   Airway Management Planned: Nasal Cannula  Additional Equipment: None  Intra-op Plan:   Post-operative Plan:   Informed Consent: I have reviewed the patients History and Physical, chart, labs and discussed the procedure including the risks, benefits and alternatives for the proposed anesthesia with the patient or authorized representative who has indicated his/her understanding and acceptance.     Dental advisory given  Plan Discussed with: CRNA and Surgeon  Anesthesia Plan Comments: (Discussed risks of anesthesia with patient, including possibility of difficulty with spontaneous ventilation under anesthesia necessitating airway intervention, PONV, and rare risks such as cardiac or respiratory or  neurological events, and allergic reactions. Discussed the role of CRNA in patient's perioperative care. Patient understands.)       Anesthesia Quick Evaluation

## 2023-06-18 ENCOUNTER — Encounter: Payer: Self-pay | Admitting: Gastroenterology

## 2023-06-18 LAB — SURGICAL PATHOLOGY

## 2023-06-27 IMAGING — US US EXREM LOW ARTERIAL SEG MULTIPLE BILAT
1 series · 1 of 1 positions shown · non-contrast
Comparison: None Available.

CLINICAL DATA: Decreased circulation

EXAM:
NONINVASIVE PHYSIOLOGIC VASCULAR STUDY OF BILATERAL LOWER
EXTREMITIES
TECHNIQUE: Non-invasive vascular evaluation of both lower extremities was
performed at rest, including calculation of ankle-brachial indices,
multiple segmental pressure evaluation, segmental Doppler and
segmental pulse volume recording.

[Series 1: us exrem low arterial seg multiple bilat · 0.23mm/px · 1 of 1 slices shown]
[im 1/1]
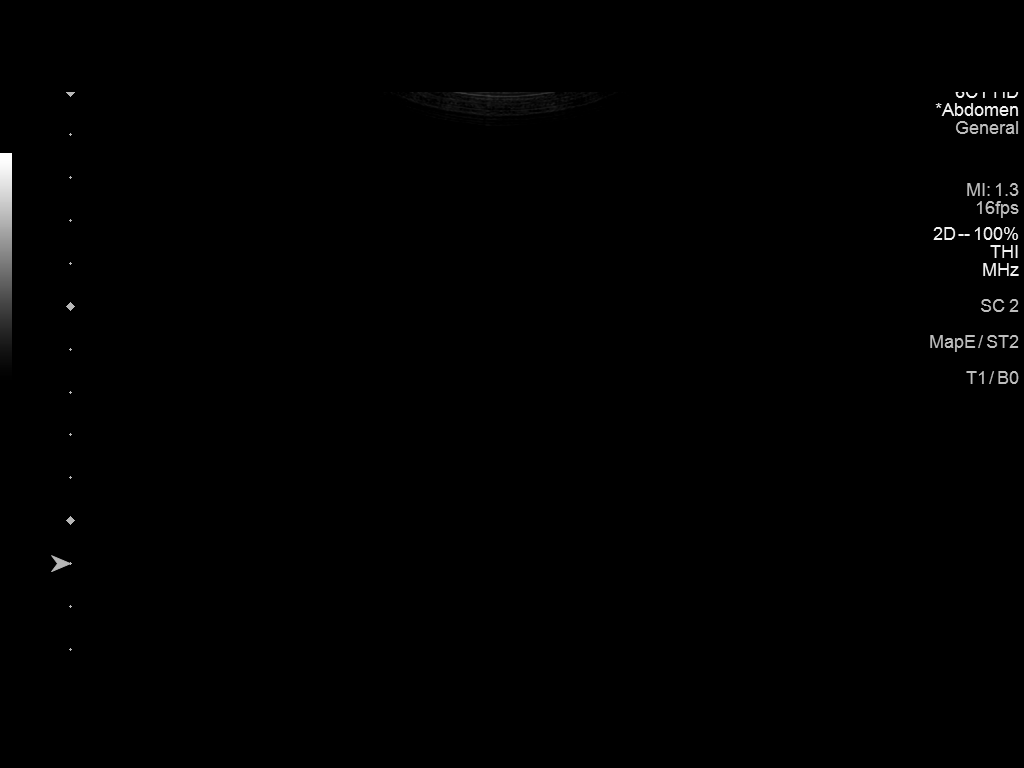

[1 of 1 positions shown; findings below may reference images not displayed]

FINDINGS: Right Lower Extremity

Resting ABI:

Resting TBI:

Segmental Pressures: Normal segmental pressures, no significant (20
mmHg) pressure gradient between adjacent segments. Great toe
pressure: 125 mm Hg

Arterial Waveforms: Multiphasic waveforms seen throughout the lower
extremities.

PVRs: Normal PVRs with maintained waveform amplitude, augmentation
and quality.

Left Lower Extremity:

Resting ABI:

Resting TBI:

Segmental Pressures: Normal segmental pressures, no significant (20
mmHg) pressure gradient between adjacent segments. Great toe
pressure: 119 mm Hg

Arterial Waveforms: Multiphasic waveform seen throughout the lower
extremities.

PVRs: Normal PVRs with maintained waveform amplitude, augmentation
and quality.

Other: Symmetric upper extremity pressures.
IMPRESSION: No evidence of significant lower extremity arterial occlusive
disease.

## 2023-11-09 ENCOUNTER — Other Ambulatory Visit: Payer: Self-pay | Admitting: Cardiovascular Disease

## 2024-05-13 ENCOUNTER — Other Ambulatory Visit: Payer: Self-pay | Admitting: Internal Medicine

## 2024-05-13 DIAGNOSIS — R0789 Other chest pain: Secondary | ICD-10-CM

## 2024-05-13 DIAGNOSIS — R9430 Abnormal result of cardiovascular function study, unspecified: Secondary | ICD-10-CM

## 2024-05-13 DIAGNOSIS — I10 Essential (primary) hypertension: Secondary | ICD-10-CM

## 2024-05-16 ENCOUNTER — Telehealth (HOSPITAL_COMMUNITY): Payer: Self-pay | Admitting: Emergency Medicine

## 2024-05-16 NOTE — Telephone Encounter (Signed)
 Reaching out to patient to offer assistance regarding upcoming cardiac imaging study; pt verbalizes understanding of appt date/time, parking situation and where to check in, pre-test NPO status and medications ordered, and verified current allergies; name and call back number provided for further questions should they arise Rockwell Alexandria RN Navigator Cardiac Imaging [facility] [phone] office [phone] cell

## 2024-05-19 ENCOUNTER — Ambulatory Visit
Admission: RE | Admit: 2024-05-19 | Discharge: 2024-05-19 | Disposition: A | Discharge status: Home / Self Care | Source: Ambulatory Visit | Attending: Internal Medicine | Admitting: Internal Medicine

## 2024-05-19 DIAGNOSIS — R0789 Other chest pain: Secondary | ICD-10-CM | POA: Insufficient documentation

## 2024-05-19 DIAGNOSIS — I25118 Atherosclerotic heart disease of native coronary artery with other forms of angina pectoris: Secondary | ICD-10-CM | POA: Insufficient documentation

## 2024-05-19 DIAGNOSIS — R931 Abnormal findings on diagnostic imaging of heart and coronary circulation: Secondary | ICD-10-CM | POA: Diagnosis not present

## 2024-05-19 DIAGNOSIS — I700 Atherosclerosis of aorta: Secondary | ICD-10-CM | POA: Insufficient documentation

## 2024-05-19 DIAGNOSIS — I10 Essential (primary) hypertension: Secondary | ICD-10-CM | POA: Insufficient documentation

## 2024-05-19 DIAGNOSIS — R9430 Abnormal result of cardiovascular function study, unspecified: Secondary | ICD-10-CM | POA: Diagnosis not present

## 2024-05-19 DIAGNOSIS — I2510 Atherosclerotic heart disease of native coronary artery without angina pectoris: Secondary | ICD-10-CM | POA: Diagnosis present

## 2024-05-19 MED ORDER — NITROGLYCERIN 0.4 MG SL SUBL
0.8000 mg | SUBLINGUAL_TABLET | Freq: Once | SUBLINGUAL | Status: AC
  Administered 2024-05-19: 0.8 mg via SUBLINGUAL
  Filled 2024-05-19: qty 25

## 2024-05-19 MED ORDER — IOHEXOL 350 MG/ML SOLN
100.0000 mL | Freq: Once | INTRAVENOUS | Status: AC | PRN
  Administered 2024-05-19: 100 mL via INTRAVENOUS

## 2024-05-19 NOTE — Progress Notes (Signed)
 Patient tolerated procedure well. Ambulate w/o difficulty. Denies any lightheadedness or being dizzy. Pt denies any pain at this time. Sitting in chair. Pt is encouraged to drink additional water throughout the day and reason explained to patient. Patient verbalized understanding and all questions answered. ABC intact. No further needs at this time. Discharge from procedure area w/o issues.

## 2024-05-20 ENCOUNTER — Other Ambulatory Visit: Payer: Self-pay | Admitting: Cardiology

## 2024-05-20 ENCOUNTER — Ambulatory Visit
Admission: RE | Admit: 2024-05-20 | Discharge: 2024-05-20 | Disposition: A | Source: Ambulatory Visit | Attending: Cardiology

## 2024-05-20 DIAGNOSIS — I25118 Atherosclerotic heart disease of native coronary artery with other forms of angina pectoris: Secondary | ICD-10-CM
